# Patient Record
Sex: Female | Born: 1975 | Race: Black or African American | Hispanic: No | Smoking: Former smoker
Health system: Southern US, Community
[De-identification: ages and names within clinical notes are randomized; demographics above are authoritative.]

## PROBLEM LIST (undated history)

## (undated) DIAGNOSIS — I10 Essential (primary) hypertension: Secondary | ICD-10-CM

## (undated) DIAGNOSIS — Z87898 Personal history of other specified conditions: Secondary | ICD-10-CM

## (undated) DIAGNOSIS — Z87820 Personal history of traumatic brain injury: Secondary | ICD-10-CM

## (undated) DIAGNOSIS — K603 Anal fistula, unspecified: Secondary | ICD-10-CM

## (undated) DIAGNOSIS — K6289 Other specified diseases of anus and rectum: Secondary | ICD-10-CM

## (undated) DIAGNOSIS — Z973 Presence of spectacles and contact lenses: Secondary | ICD-10-CM

---

## 2001-10-15 ENCOUNTER — Encounter: Payer: Self-pay | Admitting: Emergency Medicine

## 2001-10-15 ENCOUNTER — Emergency Department (HOSPITAL_COMMUNITY): Admission: EM | Admit: 2001-10-15 | Discharge: 2001-10-15 | Payer: Self-pay | Admitting: Emergency Medicine

## 2001-12-11 ENCOUNTER — Emergency Department (HOSPITAL_COMMUNITY): Admission: EM | Admit: 2001-12-11 | Discharge: 2001-12-11 | Payer: Self-pay | Admitting: Emergency Medicine

## 2001-12-21 ENCOUNTER — Emergency Department (HOSPITAL_COMMUNITY): Admission: EM | Admit: 2001-12-21 | Discharge: 2001-12-21 | Payer: Self-pay | Admitting: Emergency Medicine

## 2001-12-25 HISTORY — PX: TUBAL LIGATION: SHX77

## 2002-06-26 ENCOUNTER — Encounter: Payer: Self-pay | Admitting: Emergency Medicine

## 2002-06-26 ENCOUNTER — Emergency Department (HOSPITAL_COMMUNITY): Admission: EM | Admit: 2002-06-26 | Discharge: 2002-06-26 | Payer: Self-pay | Admitting: Emergency Medicine

## 2003-03-13 ENCOUNTER — Emergency Department (HOSPITAL_COMMUNITY): Admission: EM | Admit: 2003-03-13 | Discharge: 2003-03-13 | Payer: Self-pay | Admitting: Emergency Medicine

## 2003-03-13 ENCOUNTER — Encounter: Payer: Self-pay | Admitting: Emergency Medicine

## 2006-12-25 HISTORY — PX: VAGINAL HYSTERECTOMY: SUR661

## 2013-09-06 ENCOUNTER — Emergency Department (INDEPENDENT_AMBULATORY_CARE_PROVIDER_SITE_OTHER): Payer: Self-pay

## 2013-09-06 ENCOUNTER — Encounter (HOSPITAL_COMMUNITY): Payer: Self-pay | Admitting: Emergency Medicine

## 2013-09-06 ENCOUNTER — Emergency Department (INDEPENDENT_AMBULATORY_CARE_PROVIDER_SITE_OTHER)
Admission: EM | Admit: 2013-09-06 | Discharge: 2013-09-06 | Disposition: A | Discharge status: Home / Self Care | Payer: Self-pay | Source: Home / Self Care | Attending: Family Medicine | Admitting: Family Medicine

## 2013-09-06 DIAGNOSIS — J069 Acute upper respiratory infection, unspecified: Secondary | ICD-10-CM

## 2013-09-06 DIAGNOSIS — M940 Chondrocostal junction syndrome [Tietze]: Secondary | ICD-10-CM

## 2013-09-06 MED ORDER — MELOXICAM 15 MG PO TABS: 15.0000 mg | ORAL_TABLET | Freq: Every day | ORAL | Status: DC | PRN

## 2013-09-06 NOTE — ED Notes (Signed)
Pt c/o pain on left side rib cage onset wed Reports she was horse playing w/a friend and believes she was elbowed Pain increases w/activity Also c/o cold sxs onset thurs Sxs include: ithcy throat, congestion, dry cough Alert w/no signs of acute distress.

## 2013-09-06 NOTE — ED Provider Notes (Signed)
Kendra Vasquez is a 37 y.o. female who presents to Urgent Care today for left rib pain. Patient was horse playing with a friend Wednesday when she was elbowed in the lateral ribs. She has continued left lateral rib pain. The pain is worse with deep inspiration and motion. She denies any significant pain at rest trouble breathing palpitations fevers or chills. She does note runny nose congestion and sore throat. This is been present since Thursday. She's tried some over-the-counter medications which been marginally helpful. She denies any nausea vomiting or diarrhea   History reviewed. No pertinent past medical history. History  Substance Use Topics  . Smoking status: Current Every Day Smoker  . Smokeless tobacco: Not on file  . Alcohol Use: No   ROS as above Medications reviewed. No current facility-administered medications for this encounter.   Current Outpatient Prescriptions  Medication Sig Dispense Refill  . meloxicam (MOBIC) 15 MG tablet Take 1 tablet (15 mg total) by mouth daily as needed for pain.  30 tablet  0    Exam:  BP 117/84  Pulse 76  Temp(Src) 97.6 F (36.4 C) (Kendra)  Resp 17  SpO2 98% Gen: Well NAD HEENT: EOMI,  MMM, clear nasal discharge Lungs: CTABL Nl WOB Chest wall: Tender palpation left lateral chest wall Heart: RRR no MRG Abd: NABS, NT, ND Exts: Non edematous BL  LE, warm and well perfused.   No results found for this or any previous visit (from the past 24 hour(s)). Dg Ribs Unilateral W/chest Left  09/06/2013   CLINICAL DATA:  Injury left side ribs 09/03/2013 persistent pain  EXAM: LEFT RIBS AND CHEST - 3+ VIEW  COMPARISON:  None.  FINDINGS: No fracture or other bone lesions are seen involving the ribs. There is no evidence of pneumothorax or pleural effusion. Both lungs are clear. Heart size and mediastinal contours are within normal limits.  IMPRESSION: Negative.   Electronically Signed   By: Natasha Mead   On: 09/06/2013 16:15    Assessment and  Plan: 37 y.o. female with left rib contusion possibly costochondritis with viral URI.  Plan to use meloxicam for symptomatic management  Followup as needed Discussed warning signs or symptoms. Please see discharge instructions. Patient expresses understanding.     Rodolph Bong, MD 09/06/13 [phone]

## 2014-03-12 ENCOUNTER — Emergency Department (INDEPENDENT_AMBULATORY_CARE_PROVIDER_SITE_OTHER)
Admission: EM | Admit: 2014-03-12 | Discharge: 2014-03-12 | Disposition: A | Discharge status: Home / Self Care | Payer: No Typology Code available for payment source | Source: Home / Self Care | Attending: Emergency Medicine | Admitting: Emergency Medicine

## 2014-03-12 ENCOUNTER — Encounter (HOSPITAL_COMMUNITY): Payer: Self-pay | Admitting: Emergency Medicine

## 2014-03-12 DIAGNOSIS — J019 Acute sinusitis, unspecified: Secondary | ICD-10-CM

## 2014-03-12 DIAGNOSIS — J069 Acute upper respiratory infection, unspecified: Secondary | ICD-10-CM

## 2014-03-12 MED ORDER — FLUTICASONE PROPIONATE 50 MCG/ACT NA SUSP: 2.0000 | Freq: Every day | NASAL | Status: DC

## 2014-03-12 MED ORDER — AMOXICILLIN 500 MG PO CAPS: 1000.0000 mg | ORAL_CAPSULE | Freq: Three times a day (TID) | ORAL | Status: DC

## 2014-03-12 MED ORDER — PREDNISONE 20 MG PO TABS: 20.0000 mg | ORAL_TABLET | Freq: Two times a day (BID) | ORAL | Status: DC

## 2014-03-12 NOTE — Discharge Instructions (Signed)

## 2014-03-12 NOTE — ED Provider Notes (Signed)
  Chief Complaint   Chief Complaint  Patient presents with  . URI    History of Present Illness   Kendra Vasquez is a 38 year old female who's had a one-week history of nasal congestion with yellow to green drainage, sneezing, postnasal drip, headache, sinus pressure, chills, fatigue and malaise, sore throat, and chest pain. She denies any fever, coughing, wheezing, or GI symptoms. She has had no specific sick exposures.  Review of Systems   Other than as noted above, the patient denies any of the following symptoms: Systemic:  No fevers, chills, sweats, or myalgias. Eye:  No redness or discharge. ENT:  No ear pain, headache, nasal congestion, drainage, sinus pressure, or sore throat. Neck:  No neck pain, stiffness, or swollen glands. Lungs:  No cough, sputum production, hemoptysis, wheezing, chest tightness, shortness of breath or chest pain. GI:  No abdominal pain, nausea, vomiting or diarrhea.  [place]   Past medical history, family history, social history, meds, and allergies were reviewed.   Physical exam   Vital signs:  BP 116/65  Pulse 60  Temp(Src) 98.5 F (36.9 C) (Oral)  Resp 18  SpO2 100% General:  Alert and oriented.  In no distress.  Skin warm and dry. Eye:  No conjunctival injection or drainage. Lids were normal. ENT:  TMs and canals were normal, without erythema or inflammation.  Nasal mucosa was clear and uncongested, without drainage.  Mucous membranes were moist.  Pharynx was clear with no exudate or drainage.  There were no oral ulcerations or lesions. Neck:  Supple, no adenopathy, tenderness or mass. Lungs:  No respiratory distress.  Lungs were clear to auscultation, without wheezes, rales or rhonchi.  Breath sounds were clear and equal bilaterally.  Heart:  Regular rhythm, without gallops, murmers or rubs. Skin:  Clear, warm, and dry, without rash or lesions.  Assessment     The primary encounter diagnosis was Viral upper respiratory infection. A  diagnosis of Acute sinusitis was also pertinent to this visit.  Plan    1.  Meds:  The following meds were prescribed:   Discharge Medication List as of 03/12/2014  9:37 AM    START taking these medications   Details  amoxicillin (AMOXIL) 500 MG capsule Take 2 capsules (1,000 mg total) by mouth 3 (three) times daily., Starting 03/12/2014, Until Discontinued, Normal    fluticasone (FLONASE) 50 MCG/ACT nasal spray Place 2 sprays into both nostrils daily., Starting 03/12/2014, Until Discontinued, Normal    predniSONE (DELTASONE) 20 MG tablet Take 1 tablet (20 mg total) by mouth 2 (two) times daily., Starting 03/12/2014, Until Discontinued, Normal        2.  Patient Education/Counseling:  The patient was given appropriate handouts, self care instructions, and instructed in symptomatic relief.  Instructed to get extra fluids, rest, and use a cool mist vaporizer.    3.  Follow up:  The patient was told to follow up here if no better in 3 to 4 days, or sooner if becoming worse in any way, and given some red flag symptoms such as increasing fever, difficulty breathing, chest pain, or persistent vomiting which would prompt immediate return.  Follow up here as needed.      Harden Mo, MD 03/12/14 [phone]

## 2014-03-12 NOTE — ED Notes (Signed)
C/o cold sx  States headache, sneezing, fatigue and head pressure OTC medications was taking

## 2014-05-26 ENCOUNTER — Ambulatory Visit: Payer: Self-pay

## 2014-11-02 ENCOUNTER — Emergency Department (HOSPITAL_COMMUNITY)
Admission: EM | Admit: 2014-11-02 | Discharge: 2014-11-02 | Disposition: A | Discharge status: Home / Self Care | Payer: No Typology Code available for payment source | Attending: Emergency Medicine | Admitting: Emergency Medicine

## 2014-11-02 ENCOUNTER — Emergency Department (HOSPITAL_COMMUNITY): Payer: No Typology Code available for payment source

## 2014-11-02 ENCOUNTER — Encounter (HOSPITAL_COMMUNITY): Payer: Self-pay

## 2014-11-02 DIAGNOSIS — S0990XA Unspecified injury of head, initial encounter: Secondary | ICD-10-CM | POA: Insufficient documentation

## 2014-11-02 DIAGNOSIS — Y9389 Activity, other specified: Secondary | ICD-10-CM | POA: Insufficient documentation

## 2014-11-02 DIAGNOSIS — Z7952 Long term (current) use of systemic steroids: Secondary | ICD-10-CM | POA: Diagnosis not present

## 2014-11-02 DIAGNOSIS — S39012A Strain of muscle, fascia and tendon of lower back, initial encounter: Secondary | ICD-10-CM | POA: Insufficient documentation

## 2014-11-02 DIAGNOSIS — Z791 Long term (current) use of non-steroidal anti-inflammatories (NSAID): Secondary | ICD-10-CM | POA: Diagnosis not present

## 2014-11-02 DIAGNOSIS — Y998 Other external cause status: Secondary | ICD-10-CM | POA: Insufficient documentation

## 2014-11-02 DIAGNOSIS — Y92410 Unspecified street and highway as the place of occurrence of the external cause: Secondary | ICD-10-CM | POA: Insufficient documentation

## 2014-11-02 DIAGNOSIS — Z7951 Long term (current) use of inhaled steroids: Secondary | ICD-10-CM | POA: Insufficient documentation

## 2014-11-02 DIAGNOSIS — Z792 Long term (current) use of antibiotics: Secondary | ICD-10-CM | POA: Insufficient documentation

## 2014-11-02 DIAGNOSIS — R011 Cardiac murmur, unspecified: Secondary | ICD-10-CM | POA: Insufficient documentation

## 2014-11-02 DIAGNOSIS — Z720 Tobacco use: Secondary | ICD-10-CM | POA: Insufficient documentation

## 2014-11-02 DIAGNOSIS — S3992XA Unspecified injury of lower back, initial encounter: Secondary | ICD-10-CM | POA: Diagnosis present

## 2014-11-02 MED ORDER — METAXALONE 800 MG PO TABS: 800.0000 mg | ORAL_TABLET | Freq: Three times a day (TID) | ORAL | Status: DC

## 2014-11-02 MED ORDER — OXYCODONE-ACETAMINOPHEN 5-325 MG PO TABS
1.0000 | ORAL_TABLET | Freq: Once | ORAL | Status: AC
  Administered 2014-11-02: 1 via ORAL
  Filled 2014-11-02: qty 1

## 2014-11-02 MED ORDER — OXYCODONE-ACETAMINOPHEN 5-325 MG PO TABS: 1.0000 | ORAL_TABLET | Freq: Four times a day (QID) | ORAL | Status: DC | PRN

## 2014-11-02 MED ORDER — IBUPROFEN 200 MG PO TABS
600.0000 mg | ORAL_TABLET | Freq: Once | ORAL | Status: AC
  Administered 2014-11-02: 600 mg via ORAL
  Filled 2014-11-02: qty 3
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
## 2014-11-02 NOTE — Discharge Instructions (Signed)
As discussed with you your xray are negative

## 2014-11-02 NOTE — ED Notes (Signed)
Discussed with [name], NP that patient is still complaining of pain.  She gives verbal order for another percocet and plan to ambulate prior to discharge.

## 2014-11-02 NOTE — ED Notes (Signed)
Patient ambulated without difficulty to room 31 to visit with family member.
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
## 2015-03-14 ENCOUNTER — Encounter (HOSPITAL_COMMUNITY): Payer: Self-pay | Admitting: Emergency Medicine

## 2015-03-14 ENCOUNTER — Emergency Department (HOSPITAL_COMMUNITY)
Admission: EM | Admit: 2015-03-14 | Discharge: 2015-03-14 | Disposition: A | Discharge status: Nursing Home (Includes ICF) | Payer: No Typology Code available for payment source | Source: Home / Self Care | Attending: Family Medicine | Admitting: Family Medicine

## 2015-03-14 ENCOUNTER — Emergency Department (HOSPITAL_COMMUNITY)
Admission: EM | Admit: 2015-03-14 | Discharge: 2015-03-14 | Disposition: A | Discharge status: Home / Self Care | Payer: No Typology Code available for payment source | Attending: Emergency Medicine | Admitting: Emergency Medicine

## 2015-03-14 ENCOUNTER — Encounter (HOSPITAL_COMMUNITY): Payer: Self-pay | Admitting: *Deleted

## 2015-03-14 ENCOUNTER — Emergency Department (INDEPENDENT_AMBULATORY_CARE_PROVIDER_SITE_OTHER): Payer: No Typology Code available for payment source

## 2015-03-14 DIAGNOSIS — M546 Pain in thoracic spine: Secondary | ICD-10-CM | POA: Insufficient documentation

## 2015-03-14 DIAGNOSIS — R079 Chest pain, unspecified: Secondary | ICD-10-CM

## 2015-03-14 DIAGNOSIS — R011 Cardiac murmur, unspecified: Secondary | ICD-10-CM | POA: Diagnosis not present

## 2015-03-14 DIAGNOSIS — R05 Cough: Secondary | ICD-10-CM | POA: Insufficient documentation

## 2015-03-14 DIAGNOSIS — Z87891 Personal history of nicotine dependence: Secondary | ICD-10-CM | POA: Diagnosis not present

## 2015-03-14 DIAGNOSIS — Z3202 Encounter for pregnancy test, result negative: Secondary | ICD-10-CM | POA: Insufficient documentation

## 2015-03-14 LAB — BASIC METABOLIC PANEL
Anion gap: 8 (ref 5–15)
BUN: 9 mg/dL (ref 6–23)
CO2: 23 mmol/L (ref 19–32)
Calcium: 9.5 mg/dL (ref 8.4–10.5)
Chloride: 108 mmol/L (ref 96–112)
Creatinine, Ser: 0.88 mg/dL (ref 0.50–1.10)
GFR calc Af Amer: >90 mL/min (ref >90)
GFR calc non Af Amer: 84 mL/min — ABNORMAL LOW (ref >90)
Glucose, Bld: 101 mg/dL — ABNORMAL HIGH (ref 70–99)
Potassium: 4 mmol/L (ref 3.5–5.1)
Sodium: 139 mmol/L (ref 135–145)

## 2015-03-14 LAB — CBC
HCT: 42.2 % (ref 36.0–46.0)
Hemoglobin: 13.9 g/dL (ref 12.0–15.0)
MCH: 29.6 pg (ref 26.0–34.0)
MCHC: 32.9 g/dL (ref 30.0–36.0)
MCV: 89.8 fL (ref 78.0–100.0)
Platelets: 209 10*3/uL (ref 150–400)
RBC: 4.7 MIL/uL (ref 3.87–5.11)
RDW: 12.6 % (ref 11.5–15.5)
WBC: 6 10*3/uL (ref 4.0–10.5)

## 2015-03-14 LAB — I-STAT TROPONIN, ED
Troponin i, poc: 0 ng/mL (ref 0.00–0.08)
Troponin i, poc: 0 ng/mL (ref 0.00–0.08)

## 2015-03-14 LAB — I-STAT BETA HCG BLOOD, ED (MC, WL, AP ONLY): I-stat hCG, quantitative: <5 m[IU]/mL (ref <5)

## 2015-03-14 MED ORDER — NAPROXEN 500 MG PO TABS: 500.0000 mg | ORAL_TABLET | Freq: Two times a day (BID) | ORAL | Status: DC

## 2015-03-14 MED ORDER — ASPIRIN 325 MG PO TABS
325.0000 mg | ORAL_TABLET | Freq: Once | ORAL | Status: AC
  Administered 2015-03-14: 325 mg via ORAL
  Filled 2015-03-14: qty 1

## 2015-03-14 MED ORDER — CYCLOBENZAPRINE HCL 10 MG PO TABS: 10.0000 mg | ORAL_TABLET | Freq: Two times a day (BID) | ORAL | Status: DC | PRN

## 2015-03-14 MED ORDER — MORPHINE SULFATE 4 MG/ML IJ SOLN
4.0000 mg | Freq: Once | INTRAMUSCULAR | Status: AC
Start: 2015-03-14 — End: 2015-03-14
  Administered 2015-03-14: 4 mg via INTRAVENOUS
  Filled 2015-03-14: qty 1

## 2015-03-14 MED ORDER — GI COCKTAIL ~~LOC~~
30.0000 mL | Freq: Once | ORAL | Status: AC
  Administered 2015-03-14: 30 mL via ORAL
  Filled 2015-03-14: qty 30

## 2015-03-14 MED ORDER — DIAZEPAM 5 MG PO TABS
5.0000 mg | ORAL_TABLET | Freq: Once | ORAL | Status: AC
  Administered 2015-03-14: 5 mg via ORAL
  Filled 2015-03-14: qty 1

## 2015-03-14 MED ORDER — TRAMADOL HCL 50 MG PO TABS: 50.0000 mg | ORAL_TABLET | Freq: Four times a day (QID) | ORAL | Status: DC | PRN

## 2015-03-14 MED ORDER — OXYCODONE-ACETAMINOPHEN 5-325 MG PO TABS
1.0000 | ORAL_TABLET | Freq: Once | ORAL | Status: AC
  Administered 2015-03-14: 1 via ORAL
  Filled 2015-03-14: qty 1

## 2015-03-14 MED ORDER — ONDANSETRON HCL 4 MG/2ML IJ SOLN
4.0000 mg | Freq: Once | INTRAMUSCULAR | Status: AC
  Administered 2015-03-14: 4 mg via INTRAVENOUS
  Filled 2015-03-14: qty 2

## 2015-03-14 NOTE — ED Provider Notes (Signed)
CSN: [id number]     Arrival date & time 03/14/15  1150 History   First MD Initiated Contact with Patient 03/14/15 1223     Chief Complaint  Patient presents with  . Chest Pain  . Back Pain     (Consider location/radiation/quality/duration/timing/severity/associated sxs/prior Treatment) HPI Pt is a 39yo female sent to ED from [facility] for further evaluation of left sided chest pain that started earlier this morning after pt woke up. Pt states pain started while she was lying in bed.  Pain is moderate to severe in left side of chest radiating around to left side of back.  Pain is sharp in nature, worse with palpation and certain movements.  Denies worsening pain with cough or deep breathing.  Denies SOB, diaphoresis, n/v/d. She has not taken any pain medication PTA.  Denies hx of similar pain.  Denies recent illness of cough or congestion. She is not on birth control. Denies leg pain or swelling. Denies hx of PE or DVT. No recent travel or sick contacts. No hx of CAD, HTN, high cholesterol, diabetes, or any other significant PMH.  No FH of CAD.  Past Medical History  Diagnosis Date  . Heart murmur    Past Surgical History  Procedure Laterality Date  . Abdominal hysterectomy    . Tubal ligation Bilateral    No family history on file. History  Substance Use Topics  . Smoking status: Former Research scientist (life sciences)  . Smokeless tobacco: Not on file  . Alcohol Use: No   OB History    No data available     Review of Systems  Constitutional: Negative for fever, chills, diaphoresis, appetite change and fatigue.  HENT: Negative for congestion, sore throat, trouble swallowing and voice change.   Respiratory: Positive for cough. Negative for shortness of breath.   Cardiovascular: Positive for chest pain ( left sided). Negative for palpitations and leg swelling.  Gastrointestinal: Negative for nausea, vomiting, abdominal pain and diarrhea.  All other systems reviewed and are negative.     Allergies  Review  of patient's allergies indicates no known allergies.  Home Medications   Prior to Admission medications   Medication Sig Start Date End Date Taking? Authorizing Provider  amoxicillin (AMOXIL) 500 MG capsule Take 2 capsules (1,000 mg total) by mouth 3 (three) times daily. Patient not taking: Reported on 03/14/2015 03/12/14   Harden Mo, MD  cyclobenzaprine (FLEXERIL) 10 MG tablet Take 1 tablet (10 mg total) by mouth 2 (two) times daily as needed for muscle spasms. 03/14/15   Noland Fordyce, PA-C  fluticasone (FLONASE) 50 MCG/ACT nasal spray Place 2 sprays into both nostrils daily. Patient not taking: Reported on 03/14/2015 03/12/14   Harden Mo, MD  meloxicam (MOBIC) 15 MG tablet Take 1 tablet (15 mg total) by mouth daily as needed for pain. Patient not taking: Reported on 03/14/2015 09/06/13   Gregor Hams, MD  metaxalone (SKELAXIN) 800 MG tablet Take 1 tablet (800 mg total) by mouth 3 (three) times daily. Patient not taking: Reported on 03/14/2015 11/02/14   Junius Creamer, NP  naproxen (NAPROSYN) 500 MG tablet Take 1 tablet (500 mg total) by mouth 2 (two) times daily. 03/14/15   Noland Fordyce, PA-C  oxyCODONE-acetaminophen (PERCOCET/ROXICET) 5-325 MG per tablet Take 1-2 tablets by mouth every 6 (six) hours as needed for severe pain. Patient not taking: Reported on 03/14/2015 11/02/14   Junius Creamer, NP  predniSONE (DELTASONE) 20 MG tablet Take 1 tablet (20 mg total) by mouth 2 (two)  times daily. Patient not taking: Reported on 03/14/2015 03/12/14   Harden Mo, MD  traMADol (ULTRAM) 50 MG tablet Take 1 tablet (50 mg total) by mouth every 6 (six) hours as needed. 03/14/15   Noland Fordyce, PA-C   BP 143/95 mmHg  Pulse 61  Temp(Src) 97.4 F (36.3 C) (Oral)  Resp 14  SpO2 100% Physical Exam  Constitutional: She appears well-developed and well-nourished. No distress.  HENT:  Head: Normocephalic and atraumatic.  Eyes: Conjunctivae are normal. No scleral icterus.  Neck: Normal range of motion.   Cardiovascular: Normal rate, regular rhythm and normal heart sounds.   Pulmonary/Chest: Effort normal and breath sounds normal. No respiratory distress. She has no wheezes. She has no rales. She exhibits tenderness.  No respiratory distress, Lungs: CTAB. Tenderness to left anterior chest. No crepitus or deformity noted.  Abdominal: Soft. Bowel sounds are normal. She exhibits no distension and no mass. There is no tenderness. There is no rebound and no guarding.  Musculoskeletal: Normal range of motion.  Neurological: She is alert.  Skin: Skin is warm and dry. No rash noted. She is not diaphoretic. No erythema.  Chest wall: no ecchymosis, erythema or rash  Nursing note and vitals reviewed.   ED Course  Procedures (including critical care time) Labs Review Labs Reviewed  BASIC METABOLIC PANEL - Abnormal; Notable for the following:    Glucose, Bld 101 (*)    GFR calc non Af Amer 84 (*)    All other components within normal limits  CBC  I-STAT TROPOININ, ED  I-STAT BETA HCG BLOOD, ED ([facility], [facility], [facility] ONLY)  I-STAT TROPOININ, ED    Imaging Review Dg Ribs Unilateral W/chest Left  03/14/2015   CLINICAL DATA:  Left-sided chest pain, no known injury  EXAM: LEFT RIBS AND CHEST - 3+ VIEW  COMPARISON:  None.  FINDINGS: Cardiac shadow is at the upper limits of normal in size. The lungs are clear bilaterally. No acute rib fracture is identified. No pneumothorax is noted.  IMPRESSION: No acute abnormality seen.   Electronically Signed   By: Inez Catalina M.D.   On: 03/14/2015 11:18     EKG Interpretation   Date/Time:  Sunday March 14 2015 11:56:12 EDT Ventricular Rate:  73 PR Interval:  176 QRS Duration: 94 QT Interval:  380 QTC Calculation: 418 R Axis:   3 Text Interpretation:  Sinus rhythm with sinus arrhythmia with Fusion  complexes Otherwise normal ECG Confirmed by Johnney Killian, MD, Jeannie Done [id number] on  03/14/2015 2:54:49 PM      MDM   Final diagnoses:  Left sided chest pain  Left-sided  thoracic back pain    Pt is a 39yo female sent to ED from [facility] for further evaluation of left sided anterior chest pain radiating into her back. Pain appears muscular in nature, however, due to questionable EKG, pt sent to ED for full cardiac workup.  Pt is PERC negative. Doubt pneumonia.  CXR from UC: no acute rib fracture. No pneumothorax noted.  No acute abnormality seen.      3:11 PM Pt states pain is worse in left side thoracic muscles but still radiating around to left anterior chest under breast. Pt has tenderness to left side thoracic muscles. Will give pt valium as well as percocet and heat pack as pain appears muscular in nature at this time.   CBC, BMP, and delta i-stat troponin: negative. Doubt ACS.  Pain is reproducible with palpation, likely muscular strain.  Will discharge home to f/u  with PCP. Rx: tramadol, flexeril, and naproxen. Home care instructions provided. Return precautions provided. Pt verbalized understanding and agreement with tx plan.      Noland Fordyce, PA-C 03/14/15 1613  Charlesetta Shanks, MD 03/17/15 2216

## 2015-03-14 NOTE — ED Provider Notes (Signed)
Kendra Vasquez is a 39 y.o. female who presents to Urgent Care today for left-sided chest pain. Patient awoke this morning with moderate to severe left-sided back pain radiating to her left anterior chest. The pain seems to be worse with arm motion. She denies the pain being worse with much deep breathing or cough. She denies exertional pain. No palpitations shortness of breath or syncope. She has not tried any medications yet. She feels well otherwise. She denies any injury.   Past Medical History  Diagnosis Date  . Heart murmur    Past Surgical History  Procedure Laterality Date  . Abdominal hysterectomy    . Tubal ligation Bilateral    History  Substance Use Topics  . Smoking status: Former Research scientist (life sciences)  . Smokeless tobacco: Not on file  . Alcohol Use: No   ROS as above Medications: No current facility-administered medications for this encounter.   Current Outpatient Prescriptions  Medication Sig Dispense Refill  . amoxicillin (AMOXIL) 500 MG capsule Take 2 capsules (1,000 mg total) by mouth 3 (three) times daily. 60 capsule 0  . fluticasone (FLONASE) 50 MCG/ACT nasal spray Place 2 sprays into both nostrils daily. 16 g 0  . meloxicam (MOBIC) 15 MG tablet Take 1 tablet (15 mg total) by mouth daily as needed for pain. 30 tablet 0  . metaxalone (SKELAXIN) 800 MG tablet Take 1 tablet (800 mg total) by mouth 3 (three) times daily. 21 tablet 0  . oxyCODONE-acetaminophen (PERCOCET/ROXICET) 5-325 MG per tablet Take 1-2 tablets by mouth every 6 (six) hours as needed for severe pain. 20 tablet 0  . predniSONE (DELTASONE) 20 MG tablet Take 1 tablet (20 mg total) by mouth 2 (two) times daily. 10 tablet 0   No Known Allergies   Exam:  BP 139/84 mmHg  Pulse 64  Temp(Src) 98.2 F (36.8 C) (Oral)  Resp 18  SpO2 99% Gen: Well NAD HEENT: EOMI,  MMM Lungs: Normal work of breathing. CTABL Heart: RRR no MRG Chest wall: Tender palpation left anterior chest wall Abd: NABS, Soft. Nondistended,  Nontender Exts: Brisk capillary refill, warm and well perfused.  Back: Nontender to spinal midline. Tender palpation left periscapular muscles.   ED ECG REPORT   Date: 03/14/2015  Rate: 61  Rhythm: normal sinus rhythm  QRS Axis: normal  Intervals: normal  ST/T Wave abnormalities: ST elevations anteriorly V2 ST seg elevated. Q wave aVL  Conduction Disutrbances:none  Narrative Interpretation:   Old EKG Reviewed: none available  I have personally reviewed the EKG tracing and agree with the computerized printout as noted.   No results found for this or any previous visit (from the past 24 hour(s)). Dg Ribs Unilateral W/chest Left  03/14/2015   CLINICAL DATA:  Left-sided chest pain, no known injury  EXAM: LEFT RIBS AND CHEST - 3+ VIEW  COMPARISON:  None.  FINDINGS: Cardiac shadow is at the upper limits of normal in size. The lungs are clear bilaterally. No acute rib fracture is identified. No pneumothorax is noted.  IMPRESSION: No acute abnormality seen.   Electronically Signed   By: Inez Catalina M.D.   On: 03/14/2015 11:18    Assessment and Plan: 39 y.o. female with left chest pain. Myofascial's most likely etiology however the severity of the patient's pain in the slightly abnormal EKG is concerning. I do not have a prior EKG to compare to. Transfer to ED via shuttle for further evaluation and management.  Discussed warning signs or symptoms. Please see discharge instructions. Patient  expresses understanding.     Gregor Hams, MD 03/14/15 [phone]

## 2015-03-14 NOTE — ED Notes (Signed)
Pt states she will use her own heating pad

## 2015-03-14 NOTE — ED Notes (Signed)
C/O waking with left mid-back pain wrapping around to left side and under left breast.  Pt's movements guarded; c/o pain worse with any movement or deep breathing.  Also c/o left arm numbness.  Denies any unusual or strenuous activity over past couple days.  Denies n/v, SOB.

## 2015-03-14 NOTE — ED Notes (Signed)
Pt. Stated, I started having shrpe pain in my boob area this morning with back pain.  I went to [facility]  And they sent me to here.

## 2015-03-14 NOTE — ED Notes (Signed)
Pt to be transferred to ED via shuttle per Dr. Georgina Snell.  Report called to Caryl Pina, ED First Nurse.

## 2015-06-09 ENCOUNTER — Encounter (HOSPITAL_COMMUNITY): Payer: Self-pay | Admitting: Emergency Medicine

## 2015-06-09 ENCOUNTER — Emergency Department (INDEPENDENT_AMBULATORY_CARE_PROVIDER_SITE_OTHER)
Admission: EM | Admit: 2015-06-09 | Discharge: 2015-06-09 | Disposition: A | Discharge status: Home / Self Care | Payer: No Typology Code available for payment source | Source: Home / Self Care | Attending: Family Medicine | Admitting: Family Medicine

## 2015-06-09 DIAGNOSIS — J40 Bronchitis, not specified as acute or chronic: Secondary | ICD-10-CM | POA: Diagnosis not present

## 2015-06-09 LAB — POCT RAPID STREP A: Streptococcus, Group A Screen (Direct): NEGATIVE

## 2015-06-09 MED ORDER — AZITHROMYCIN 250 MG PO TABS: 250.0000 mg | ORAL_TABLET | Freq: Every day | ORAL | Status: DC

## 2015-06-09 MED ORDER — GUAIFENESIN-CODEINE 100-10 MG/5ML PO SOLN: 5.0000 mL | Freq: Every evening | ORAL | Status: DC | PRN

## 2015-06-09 MED ORDER — PREDNISONE 10 MG PO TABS: 30.0000 mg | ORAL_TABLET | Freq: Every day | ORAL | Status: DC

## 2015-06-09 NOTE — ED Provider Notes (Signed)
Kendra Vasquez is a 39 y.o. female who presents to Urgent Care today for body aches cough congestion sore throat headache facial pressure nasal discharge. Cough is productive for the last 2 weeks. No significant wheezing or shortness of breath vomiting or diarrhea. She has not tried many medicines.   Past Medical History  Diagnosis Date  . Heart murmur    Past Surgical History  Procedure Laterality Date  . Abdominal hysterectomy    . Tubal ligation Bilateral    History  Substance Use Topics  . Smoking status: Former Research scientist (life sciences)  . Smokeless tobacco: Not on file  . Alcohol Use: No   ROS as above Medications: No current facility-administered medications for this encounter.   Current Outpatient Prescriptions  Medication Sig Dispense Refill  . azithromycin (ZITHROMAX) 250 MG tablet Take 1 tablet (250 mg total) by mouth daily. Take first 2 tablets together, then 1 every day until finished. 6 tablet 0  . guaiFENesin-codeine 100-10 MG/5ML syrup Take 5 mLs by mouth at bedtime as needed for cough. 120 mL 0  . predniSONE (DELTASONE) 10 MG tablet Take 3 tablets (30 mg total) by mouth daily. 15 tablet 0  . [DISCONTINUED] fluticasone (FLONASE) 50 MCG/ACT nasal spray Place 2 sprays into both nostrils daily. (Patient not taking: Reported on 03/14/2015) 16 g 0   No Known Allergies   Exam:  BP 125/91 mmHg  Pulse 69  Temp(Src) 99 F (37.2 C) (Oral)  Resp 16  SpO2 100% Gen: Well NAD HEENT: EOMI,  MMM clear nasal discharge. Posterior pharynx with cobblestoning. Normal tympanic membranes bilaterally. Lungs: Normal work of breathing. Coarse breath sounds bilaterally Heart: RRR no MRG Abd: NABS, Soft. Nondistended, Nontender Exts: Brisk capillary refill, warm and well perfused.   Results for orders placed or performed during the hospital encounter of 06/09/15 (from the past 24 hour(s))  POCT rapid strep A [facility])     Status: None   Collection Time: 06/09/15  4:18 PM  Result Value Ref  Range   Streptococcus, Group A Screen (Direct) NEGATIVE NEGATIVE   No results found.  Assessment and Plan: 39 y.o. female with bronchitis. Treat with prednisone and codeine cough syrup. Use azithromycin if not getting better.  Discussed warning signs or symptoms. Please see discharge instructions. Patient expresses understanding.     Gregor Hams, MD 06/09/15 [phone]

## 2015-06-09 NOTE — Discharge Instructions (Signed)
Thank you for coming in today. Call or go to the emergency room if you get worse, have trouble breathing, have chest pains, or palpitations.  Take prednisone and cough medicine.  Do not drive after taking the cough medicine.  Use azithromycin antibiotic if not better.  Acute Bronchitis Bronchitis is inflammation of the airways that extend from the windpipe into the lungs (bronchi). The inflammation often causes mucus to develop. This leads to a cough, which is the most common symptom of bronchitis.  In acute bronchitis, the condition usually develops suddenly and goes away over time, usually in a couple weeks. Smoking, allergies, and asthma can make bronchitis worse. Repeated episodes of bronchitis may cause further lung problems.  CAUSES Acute bronchitis is most often caused by the same virus that causes a cold. The virus can spread from person to person (contagious) through coughing, sneezing, and touching contaminated objects. SIGNS AND SYMPTOMS   Cough.   Fever.   Coughing up mucus.   Body aches.   Chest congestion.   Chills.   Shortness of breath.   Sore throat.  DIAGNOSIS  Acute bronchitis is usually diagnosed through a physical exam. Your health care provider will also ask you questions about your medical history. Tests, such as chest X-rays, are sometimes done to rule out other conditions.  TREATMENT  Acute bronchitis usually goes away in a couple weeks. Oftentimes, no medical treatment is necessary. Medicines are sometimes given for relief of fever or cough. Antibiotic medicines are usually not needed but may be prescribed in certain situations. In some cases, an inhaler may be recommended to help reduce shortness of breath and control the cough. A cool mist vaporizer may also be used to help thin bronchial secretions and make it easier to clear the chest.  HOME CARE INSTRUCTIONS  Get plenty of rest.   Drink enough fluids to keep your urine clear or pale yellow  (unless you have a medical condition that requires fluid restriction). Increasing fluids may help thin your respiratory secretions (sputum) and reduce chest congestion, and it will prevent dehydration.   Take medicines only as directed by your health care provider.  If you were prescribed an antibiotic medicine, finish it all even if you start to feel better.  Avoid smoking and secondhand smoke. Exposure to cigarette smoke or irritating chemicals will make bronchitis worse. If you are a smoker, consider using nicotine gum or skin patches to help control withdrawal symptoms. Quitting smoking will help your lungs heal faster.   Reduce the chances of another bout of acute bronchitis by washing your hands frequently, avoiding people with cold symptoms, and trying not to touch your hands to your mouth, nose, or eyes.   Keep all follow-up visits as directed by your health care provider.  SEEK MEDICAL CARE IF: Your symptoms do not improve after 1 week of treatment.  SEEK IMMEDIATE MEDICAL CARE IF:  You develop an increased fever or chills.   You have chest pain.   You have severe shortness of breath.  You have bloody sputum.   You develop dehydration.  You faint or repeatedly feel like you are going to pass out.  You develop repeated vomiting.  You develop a severe headache. MAKE SURE YOU:   Understand these instructions.  Will watch your condition.  Will get help right away if you are not doing well or get worse. Document Released: 01/18/2005 Document Revised: 04/27/2014 Document Reviewed: 06/03/2013 [facility] Patient Information 2015 [place], [place]. This information is not intended  to replace advice given to you by your health care provider. Make sure you discuss any questions you have with your health care provider. ° °

## 2015-06-09 NOTE — ED Notes (Signed)
C/o ST associated w/a prod cough onset 1 week Also reports neck, back pain onset Saturday Alert, no signs of acute distress.

## 2015-06-11 LAB — CULTURE, GROUP A STREP: Strep A Culture: NEGATIVE

## 2015-10-20 ENCOUNTER — Emergency Department (HOSPITAL_COMMUNITY)
Admission: EM | Admit: 2015-10-20 | Discharge: 2015-10-20 | Disposition: A | Discharge status: Home / Self Care | Payer: No Typology Code available for payment source | Attending: Emergency Medicine | Admitting: Emergency Medicine

## 2015-10-20 ENCOUNTER — Emergency Department (HOSPITAL_COMMUNITY): Payer: No Typology Code available for payment source

## 2015-10-20 ENCOUNTER — Encounter (HOSPITAL_COMMUNITY): Payer: Self-pay | Admitting: *Deleted

## 2015-10-20 DIAGNOSIS — S99922A Unspecified injury of left foot, initial encounter: Secondary | ICD-10-CM | POA: Insufficient documentation

## 2015-10-20 DIAGNOSIS — S20212A Contusion of left front wall of thorax, initial encounter: Secondary | ICD-10-CM | POA: Insufficient documentation

## 2015-10-20 DIAGNOSIS — Y939 Activity, unspecified: Secondary | ICD-10-CM | POA: Insufficient documentation

## 2015-10-20 DIAGNOSIS — S060X1A Concussion with loss of consciousness of 30 minutes or less, initial encounter: Secondary | ICD-10-CM | POA: Diagnosis not present

## 2015-10-20 DIAGNOSIS — Y929 Unspecified place or not applicable: Secondary | ICD-10-CM | POA: Insufficient documentation

## 2015-10-20 DIAGNOSIS — R011 Cardiac murmur, unspecified: Secondary | ICD-10-CM | POA: Insufficient documentation

## 2015-10-20 DIAGNOSIS — Z792 Long term (current) use of antibiotics: Secondary | ICD-10-CM | POA: Diagnosis not present

## 2015-10-20 DIAGNOSIS — S92512A Displaced fracture of proximal phalanx of left lesser toe(s), initial encounter for closed fracture: Secondary | ICD-10-CM | POA: Insufficient documentation

## 2015-10-20 DIAGNOSIS — Y999 Unspecified external cause status: Secondary | ICD-10-CM | POA: Diagnosis not present

## 2015-10-20 DIAGNOSIS — Z7952 Long term (current) use of systemic steroids: Secondary | ICD-10-CM | POA: Insufficient documentation

## 2015-10-20 DIAGNOSIS — S80212A Abrasion, left knee, initial encounter: Secondary | ICD-10-CM | POA: Insufficient documentation

## 2015-10-20 DIAGNOSIS — S20219A Contusion of unspecified front wall of thorax, initial encounter: Secondary | ICD-10-CM

## 2015-10-20 DIAGNOSIS — Z87891 Personal history of nicotine dependence: Secondary | ICD-10-CM | POA: Insufficient documentation

## 2015-10-20 DIAGNOSIS — S92502A Displaced unspecified fracture of left lesser toe(s), initial encounter for closed fracture: Secondary | ICD-10-CM

## 2015-10-20 DIAGNOSIS — S0990XA Unspecified injury of head, initial encounter: Secondary | ICD-10-CM | POA: Diagnosis present

## 2015-10-20 MED ORDER — ONDANSETRON 4 MG PO TBDP
8.0000 mg | ORAL_TABLET | Freq: Once | ORAL | Status: AC
  Administered 2015-10-20: 8 mg via ORAL
  Filled 2015-10-20: qty 2

## 2015-10-20 MED ORDER — NAPROXEN 500 MG PO TABS: 500.0000 mg | ORAL_TABLET | Freq: Two times a day (BID) | ORAL | Status: DC

## 2015-10-20 MED ORDER — HYDROMORPHONE HCL 1 MG/ML IJ SOLN
2.0000 mg | Freq: Once | INTRAMUSCULAR | Status: AC
  Administered 2015-10-20: 2 mg via INTRAMUSCULAR
  Filled 2015-10-20: qty 2

## 2015-10-20 MED ORDER — OXYCODONE-ACETAMINOPHEN 5-325 MG PO TABS: 1.0000 | ORAL_TABLET | ORAL | Status: DC | PRN

## 2015-10-20 NOTE — ED Notes (Signed)
MD at bedside. 
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
## 2015-10-20 NOTE — Discharge Instructions (Signed)
Buddy-tape toes as needed.  General Assault Assault includes any behavior or physical attack--whether it is on purpose or not--that results in injury to another person, damage to property, or both. This also includes assault that has not yet happened, but is planned to happen. Threats of assault may be physical, verbal, or written. They may be said or sent by:  Mail.  E-mail.  Text.  Social media.  Fax. The threats may be direct, implied, or understood. WHAT ARE THE DIFFERENT FORMS OF ASSAULT? Forms of assault include:  Physically assaulting a person. This includes physical threats to inflict physical harm as well as:  Slapping.  Hitting.  Poking.  Kicking.  Punching.  Pushing.  Sexually assaulting a person. Sexual assault is any sexual activity that a person is forced, threatened, or coerced to participate in. It may or may not involve physical contact with the person who is assaulting you. You are sexually assaulted if you are forced to have sexual contact of any kind.  Damaging or destroying a person's assistive equipment, such as glasses, canes, or walkers.  Throwing or hitting objects.  Using or displaying a weapon to harm or threaten someone.  Using or displaying an object that appears to be a weapon in a threatening manner.  Using greater physical size or strength to intimidate someone.  Making intimidating or threatening gestures.  Bullying.  Hazing.  Using language that is intimidating, threatening, hostile, or abusive.  Stalking.  Restraining someone with force. WHAT SHOULD I DO IF I EXPERIENCE ASSAULT?  Report assaults, threats, and stalking to the police. Call your local emergency services (911 in the [place] if you are in immediate danger or you need medical help.  You can work with a Chief Executive Officer or an advocate to get legal protection against someone who has assaulted you or threatened you with assault. Protection includes restraining orders and  private addresses. Crimes against you, such as assault, can also be prosecuted through the courts. Laws will vary depending on where you live.   This information is not intended to replace advice given to you by your health care provider. Make sure you discuss any questions you have with your health care provider.   Document Released: 12/11/2005 Document Revised: 01/01/2015 Document Reviewed: 08/28/2014 Elsevier Interactive Patient Education 2016 [employer]  Toe Fracture A toe fracture is a break in one of the toe bones (phalanges). CAUSES This condition may be caused by:  Dropping a heavy object on your toe.  Stubbing your toe.  Overusing your toe or doing repetitive exercise.  Twisting or stretching your toe out of place. RISK FACTORS This condition is more likely to develop in people who:  Play contact sports.  Have a bone disease.  Have a low calcium level. SYMPTOMS The main symptoms of this condition are swelling and pain in the toe. The pain may get worse with standing or walking. Other symptoms include:  Bruising.  Stiffness.  Numbness.  A change in the way the toe looks.  Broken bones that poke through the skin.  Blood beneath the toenail. DIAGNOSIS This condition is diagnosed with a physical exam. You may also have X-rays. TREATMENT  Treatment for this condition depends on the type of fracture and its severity. Treatment may involve:  Taping the broken toe to a toe that is next to it (buddy taping). This is the most common treatment for fractures in which the bone has not moved out of place (nondisplaced fracture).  Wearing a shoe that has  a wide, rigid sole to protect the toe and to limit its movement.  Wearing a walking cast.  Having a procedure to move the toe back into place.  Surgery. This may be needed:  If there are many pieces of broken bone that are out of place (displaced).  If the toe joint breaks.  If the bone breaks through the  skin.  Physical therapy. This is done to help regain movement and strength in the toe. You may need follow-up X-rays to make sure that the bone is healing well and staying in position. HOME CARE INSTRUCTIONS If You Have a Cast:  Do not stick anything inside the cast to scratch your skin. Doing that increases your risk of infection.  Check the skin around the cast every day. Report any concerns to your health care provider. You may put lotion on dry skin around the edges of the cast. Do not apply lotion to the skin underneath the cast.  Do not put pressure on any part of the cast until it is fully hardened. This may take several hours.  Keep the cast clean and dry. Bathing  Do not take baths, swim, or use a hot tub until your health care provider approves. Ask your health care provider if you can take showers. You may only be allowed to take sponge baths for bathing.  If your health care provider approves bathing and showering, cover the cast or bandage (dressing) with a watertight plastic bag to protect it from water. Do not let the cast or dressing get wet. Managing Pain, Stiffness, and Swelling  If you do not have a cast, apply ice to the injured area, if directed.  Put ice in a plastic bag.  Place a towel between your skin and the bag.  Leave the ice on for 20 minutes, 2-3 times per day.  Move your toes often to avoid stiffness and to lessen swelling.  Raise (elevate) the injured area above the level of your heart while you are sitting or lying down. Driving  Do not drive or operate heavy machinery while taking pain medicine.  Do not drive while wearing a cast on a foot that you use for driving. Activity  Return to your normal activities as directed by your health care provider. Ask your health care provider what activities are safe for you.  Perform exercises daily as directed by your health care provider or physical therapist. Safety  Do not use the injured limb to  support your body weight until your health care provider says that you can. Use crutches or other assistive devices as directed by your health care provider. General Instructions  If your toe was treated with buddy taping, follow your health care provider's instructions for changing the gauze and tape. Change it more often:  The gauze and tape get wet. If this happens, dry the space between the toes.  The gauze and tape are too tight and cause your toe to become pale or numb.  Wear a protective shoe as directed by your health care provider. If you were not given a protective shoe, wear sturdy, supportive shoes. Your shoes should not pinch your toes and should not fit tightly against your toes.  Do not use any tobacco products, including cigarettes, chewing tobacco, or e-cigarettes. Tobacco can delay bone healing. If you need help quitting, ask your health care provider.  Take medicines only as directed by your health care provider.  Keep all follow-up visits as directed by your health  care provider. This is important. SEEK MEDICAL CARE IF:  You have a fever.  Your pain medicine is not helping.  Your toe is cold.  Your toe is numb.  You still have pain after one week of rest and treatment.  You still have pain after your health care provider has said that you can start walking again.  You have pain, tingling, or numbness in your foot that is not going away. SEEK IMMEDIATE MEDICAL CARE IF:  You have severe pain.  You have redness or inflammation in your toe that is getting worse.  You have pain or numbness in your toe that is getting worse.  Your toe turns blue.   This information is not intended to replace advice given to you by your health care provider. Make sure you discuss any questions you have with your health care provider.   Document Released: 12/08/2000 Document Revised: 09/01/2015 Document Reviewed: 10/07/2014 Elsevier Interactive Patient Education 2016 [place] A contusion is a deep bruise. Contusions are the result of a blunt injury to tissues and muscle fibers under the skin. The injury causes bleeding under the skin. The skin overlying the contusion may turn blue, purple, or yellow. Minor injuries will give you a painless contusion, but more severe contusions may stay painful and swollen for a few weeks.  CAUSES  This condition is usually caused by a blow, trauma, or direct force to an area of the body. SYMPTOMS  Symptoms of this condition include:  Swelling of the injured area.  Pain and tenderness in the injured area.  Discoloration. The area may have redness and then turn blue, purple, or yellow. DIAGNOSIS  This condition is diagnosed based on a physical exam and medical history. An X-ray, CT scan, or MRI may be needed to determine if there are any associated injuries, such as broken bones (fractures). TREATMENT  Specific treatment for this condition depends on what area of the body was injured. In general, the best treatment for a contusion is resting, icing, applying pressure to (compression), and elevating the injured area. This is often called the RICE strategy. Over-the-counter anti-inflammatory medicines may also be recommended for pain control.  HOME CARE INSTRUCTIONS   Rest the injured area.  If directed, apply ice to the injured area:  Put ice in a plastic bag.  Place a towel between your skin and the bag.  Leave the ice on for 20 minutes, 2-3 times per day.  If directed, apply light compression to the injured area using an elastic bandage. Make sure the bandage is not wrapped too tightly. Remove and reapply the bandage as directed by your health care provider.  If possible, raise (elevate) the injured area above the level of your heart while you are sitting or lying down.  Take over-the-counter and prescription medicines only as told by your health care provider. SEEK MEDICAL CARE IF:  Your symptoms do  not improve after several days of treatment.  Your symptoms get worse.  You have difficulty moving the injured area. SEEK IMMEDIATE MEDICAL CARE IF:   You have severe pain.  You have numbness in a hand or foot.  Your hand or foot turns pale or cold.   This information is not intended to replace advice given to you by your health care provider. Make sure you discuss any questions you have with your health care provider.   Document Released: 09/20/2005 Document Revised: 09/01/2015 Document Reviewed: 04/28/2015 Elsevier Interactive Patient Education [employer].  Concussion, Adult A concussion, or closed-head injury, is a brain injury caused by a direct blow to the head or by a quick and sudden movement (jolt) of the head or neck. Concussions are usually not life-threatening. Even so, the effects of a concussion can be serious. If you have had a concussion before, you are more likely to experience concussion-like symptoms after a direct blow to the head.  CAUSES  Direct blow to the head, such as from running into another player during a soccer game, being hit in a fight, or hitting your head on a hard surface.  A jolt of the head or neck that causes the brain to move back and forth inside the skull, such as in a car crash. SIGNS AND SYMPTOMS The signs of a concussion can be hard to notice. Early on, they may be missed by you, family members, and health care providers. You may look fine but act or feel differently. Symptoms are usually temporary, but they may last for days, weeks, or even longer. Some symptoms may appear right away while others may not show up for hours or days. Every head injury is different. Symptoms include:  Mild to moderate headaches that will not go away.  A feeling of pressure inside your head.  Having more trouble than usual:  Learning or remembering things you have heard.  Answering questions.  Paying attention or concentrating.  Organizing daily  tasks.  Making decisions and solving problems.  Slowness in thinking, acting or reacting, speaking, or reading.  Getting lost or being easily confused.  Feeling tired all the time or lacking energy (fatigued).  Feeling drowsy.  Sleep disturbances.  Sleeping more than usual.  Sleeping less than usual.  Trouble falling asleep.  Trouble sleeping (insomnia).  Loss of balance or feeling lightheaded or dizzy.  Nausea or vomiting.  Numbness or tingling.  Increased sensitivity to:  Sounds.  Lights.  Distractions.  Vision problems or eyes that tire easily.  Diminished sense of taste or smell.  Ringing in the ears.  Mood changes such as feeling sad or anxious.  Becoming easily irritated or angry for little or no reason.  Lack of motivation.  Seeing or hearing things other people do not see or hear (hallucinations). DIAGNOSIS Your health care provider can usually diagnose a concussion based on a description of your injury and symptoms. He or she will ask whether you passed out (lost consciousness) and whether you are having trouble remembering events that happened right before and during your injury. Your evaluation might include:  A brain scan to look for signs of injury to the brain. Even if the test shows no injury, you may still have a concussion.  Blood tests to be sure other problems are not present. TREATMENT  Concussions are usually treated in an emergency department, in urgent care, or at a clinic. You may need to stay in the hospital overnight for further treatment.  Tell your health care provider if you are taking any medicines, including prescription medicines, over-the-counter medicines, and natural remedies. Some medicines, such as blood thinners (anticoagulants) and aspirin, may increase the chance of complications. Also tell your health care provider whether you have had alcohol or are taking illegal drugs. This information may affect  treatment.  Your health care provider will send you home with important instructions to follow.  How fast you will recover from a concussion depends on many factors. These factors include how severe your concussion is, what part of your brain was  injured, your age, and how healthy you were before the concussion.  Most people with mild injuries recover fully. Recovery can take time. In general, recovery is slower in older persons. Also, persons who have had a concussion in the past or have other medical problems may find that it takes longer to recover from their current injury. HOME CARE INSTRUCTIONS General Instructions  Carefully follow the directions your health care provider gave you.  Only take over-the-counter or prescription medicines for pain, discomfort, or fever as directed by your health care provider.  Take only those medicines that your health care provider has approved.  Do not drink alcohol until your health care provider says you are well enough to do so. Alcohol and certain other drugs may slow your recovery and can put you at risk of further injury.  If it is harder than usual to remember things, write them down.  If you are easily distracted, try to do one thing at a time. For example, do not try to watch TV while fixing dinner.  Talk with family members or close friends when making important decisions.  Keep all follow-up appointments. Repeated evaluation of your symptoms is recommended for your recovery.  Watch your symptoms and tell others to do the same. Complications sometimes occur after a concussion. Older adults with a brain injury may have a higher risk of serious complications, such as a blood clot on the brain.  Tell your teachers, school nurse, school counselor, coach, athletic trainer, or work Freight forwarder about your injury, symptoms, and restrictions. Tell them about what you can or cannot do. They should watch for:  Increased problems with attention or  concentration.  Increased difficulty remembering or learning new information.  Increased time needed to complete tasks or assignments.  Increased irritability or decreased ability to cope with stress.  Increased symptoms.  Rest. Rest helps the brain to heal. Make sure you:  Get plenty of sleep at night. Avoid staying up late at night.  Keep the same bedtime hours on weekends and weekdays.  Rest during the day. Take daytime naps or rest breaks when you feel tired.  Limit activities that require a lot of thought or concentration. These include:  Doing homework or job-related work.  Watching TV.  Working on the computer.  Avoid any situation where there is potential for another head injury (football, hockey, soccer, basketball, martial arts, downhill snow sports and horseback riding). Your condition will get worse every time you experience a concussion. You should avoid these activities until you are evaluated by the appropriate follow-up health care providers. Returning To Your Regular Activities You will need to return to your normal activities slowly, not all at once. You must give your body and brain enough time for recovery.  Do not return to sports or other athletic activities until your health care provider tells you it is safe to do so.  Ask your health care provider when you can drive, ride a bicycle, or operate heavy machinery. Your ability to react may be slower after a brain injury. Never do these activities if you are dizzy.  Ask your health care provider about when you can return to work or school. Preventing Another Concussion It is very important to avoid another brain injury, especially before you have recovered. In rare cases, another injury can lead to permanent brain damage, brain swelling, or death. The risk of this is greatest during the first 7-10 days after a head injury. Avoid injuries by:  Wearing a  seat belt when riding in a car.  Drinking alcohol only  in moderation.  Wearing a helmet when biking, skiing, skateboarding, skating, or doing similar activities.  Avoiding activities that could lead to a second concussion, such as contact or recreational sports, until your health care provider says it is okay.  Taking safety measures in your home.  Remove clutter and tripping hazards from floors and stairways.  Use grab bars in bathrooms and handrails by stairs.  Place non-slip mats on floors and in bathtubs.  Improve lighting in dim areas. SEEK MEDICAL CARE IF:  You have increased problems paying attention or concentrating.  You have increased difficulty remembering or learning new information.  You need more time to complete tasks or assignments than before.  You have increased irritability or decreased ability to cope with stress.  You have more symptoms than before. Seek medical care if you have any of the following symptoms for more than 2 weeks after your injury:  Lasting (chronic) headaches.  Dizziness or balance problems.  Nausea.  Vision problems.  Increased sensitivity to noise or light.  Depression or mood swings.  Anxiety or irritability.  Memory problems.  Difficulty concentrating or paying attention.  Sleep problems.  Feeling tired all the time. SEEK IMMEDIATE MEDICAL CARE IF:  You have severe or worsening headaches. These may be a sign of a blood clot in the brain.  You have weakness (even if only in one hand, leg, or part of the face).  You have numbness.  You have decreased coordination.  You vomit repeatedly.  You have increased sleepiness.  One pupil is larger than the other.  You have convulsions.  You have slurred speech.  You have increased confusion. This may be a sign of a blood clot in the brain.  You have increased restlessness, agitation, or irritability.  You are unable to recognize people or places.  You have neck pain.  It is difficult to wake you up.  You have  unusual behavior changes.  You lose consciousness. MAKE SURE YOU:  Understand these instructions.  Will watch your condition.  Will get help right away if you are not doing well or get worse.   This information is not intended to replace advice given to you by your health care provider. Make sure you discuss any questions you have with your health care provider.   Document Released: 03/02/2004 Document Revised: 01/01/2015 Document Reviewed: 07/03/2013 Elsevier Interactive Patient Education 2016 Elsevier Inc.  Naproxen and naproxen sodium oral immediate-release tablets What is this medicine? NAPROXEN (na PROX en) is a non-steroidal anti-inflammatory drug (NSAID). It is used to reduce swelling and to treat pain. This medicine may be used for dental pain, headache, or painful monthly periods. It is also used for painful joint and muscular problems such as arthritis, tendinitis, bursitis, and gout. This medicine may be used for other purposes; ask your health care provider or pharmacist if you have questions. What should I tell my health care provider before I take this medicine? They need to know if you have any of these conditions: -asthma -cigarette smoker -drink more than 3 alcohol containing drinks a day -heart disease or circulation problems such as heart failure or leg edema (fluid retention) -high blood pressure -kidney disease -liver disease -stomach bleeding or ulcers -an unusual or allergic reaction to naproxen, aspirin, other NSAIDs, other medicines, foods, dyes, or preservatives -pregnant or trying to get pregnant -breast-feeding How should I use this medicine? Take this medicine by mouth  with a glass of water. Follow the directions on the prescription label. Take it with food if your stomach gets upset. Try to not lie down for at least 10 minutes after you take it. Take your medicine at regular intervals. Do not take your medicine more often than directed. Long-term,  continuous use may increase the risk of heart attack or stroke. A special MedGuide will be given to you by the pharmacist with each prescription and refill. Be sure to read this information carefully each time. Talk to your pediatrician regarding the use of this medicine in children. Special care may be needed. Overdosage: If you think you have taken too much of this medicine contact a poison control center or emergency room at once. NOTE: This medicine is only for you. Do not share this medicine with others. What if I miss a dose? If you miss a dose, take it as soon as you can. If it is almost time for your next dose, take only that dose. Do not take double or extra doses. What may interact with this medicine? -alcohol -aspirin -cidofovir -diuretics -lithium -methotrexate -other drugs for inflammation like ketorolac or prednisone -pemetrexed -probenecid -warfarin This list may not describe all possible interactions. Give your health care provider a list of all the medicines, herbs, non-prescription drugs, or dietary supplements you use. Also tell them if you smoke, drink alcohol, or use illegal drugs. Some items may interact with your medicine. What should I watch for while using this medicine? Tell your doctor or health care professional if your pain does not get better. Talk to your doctor before taking another medicine for pain. Do not treat yourself. This medicine does not prevent heart attack or stroke. In fact, this medicine may increase the chance of a heart attack or stroke. The chance may increase with longer use of this medicine and in people who have heart disease. If you take aspirin to prevent heart attack or stroke, talk with your doctor or health care professional. Do not take other medicines that contain aspirin, ibuprofen, or naproxen with this medicine. Side effects such as stomach upset, nausea, or ulcers may be more likely to occur. Many medicines available without a  prescription should not be taken with this medicine. This medicine can cause ulcers and bleeding in the stomach and intestines at any time during treatment. Do not smoke cigarettes or drink alcohol. These increase irritation to your stomach and can make it more susceptible to damage from this medicine. Ulcers and bleeding can happen without warning symptoms and can cause death. You may get drowsy or dizzy. Do not drive, use machinery, or do anything that needs mental alertness until you know how this medicine affects you. Do not stand or sit up quickly, especially if you are an older patient. This reduces the risk of dizzy or fainting spells. This medicine can cause you to bleed more easily. Try to avoid damage to your teeth and gums when you brush or floss your teeth. What side effects may I notice from receiving this medicine? Side effects that you should report to your doctor or health care professional as soon as possible: -black or bloody stools, blood in the urine or vomit -blurred vision -chest pain -difficulty breathing or wheezing -nausea or vomiting -severe stomach pain -skin rash, skin redness, blistering or peeling skin, hives, or itching -slurred speech or weakness on one side of the body -swelling of eyelids, throat, lips -unexplained weight gain or swelling -unusually weak or tired -yellowing  of eyes or skin Side effects that usually do not require medical attention (report to your doctor or health care professional if they continue or are bothersome): -constipation -headache -heartburn This list may not describe all possible side effects. Call your doctor for medical advice about side effects. You may report side effects to FDA at 1-800-FDA-1088. Where should I keep my medicine? Keep out of the reach of children. Store at room temperature between 15 and 30 degrees C (59 and 86 degrees F). Keep container tightly closed. Throw away any unused medicine after the expiration  date. NOTE: This sheet is a summary. It may not cover all possible information. If you have questions about this medicine, talk to your doctor, pharmacist, or health care provider.    2016, Elsevier/Gold Standard. (2009-12-13 20:10:16)  Acetaminophen; Oxycodone tablets What is this medicine? ACETAMINOPHEN; OXYCODONE (a set a MEE noe fen; ox i KOE done) is a pain reliever. It is used to treat moderate to severe pain. This medicine may be used for other purposes; ask your health care provider or pharmacist if you have questions. What should I tell my health care provider before I take this medicine? They need to know if you have any of these conditions: -brain tumor -Crohn's disease, inflammatory bowel disease, or ulcerative colitis -drug abuse or addiction -head injury -heart or circulation problems -if you often drink alcohol -kidney disease or problems going to the bathroom -liver disease -lung disease, asthma, or breathing problems -an unusual or allergic reaction to acetaminophen, oxycodone, other opioid analgesics, other medicines, foods, dyes, or preservatives -pregnant or trying to get pregnant -breast-feeding How should I use this medicine? Take this medicine by mouth with a full glass of water. Follow the directions on the prescription label. You can take it with or without food. If it upsets your stomach, take it with food. Take your medicine at regular intervals. Do not take it more often than directed. Talk to your pediatrician regarding the use of this medicine in children. Special care may be needed. Patients over 22 years old may have a stronger reaction and need a smaller dose. Overdosage: If you think you have taken too much of this medicine contact a poison control center or emergency room at once. NOTE: This medicine is only for you. Do not share this medicine with others. What if I miss a dose? If you miss a dose, take it as soon as you can. If it is almost time for  your next dose, take only that dose. Do not take double or extra doses. What may interact with this medicine? -alcohol -antihistamines -barbiturates like amobarbital, butalbital, butabarbital, methohexital, pentobarbital, phenobarbital, thiopental, and secobarbital -benztropine -drugs for bladder problems like solifenacin, trospium, oxybutynin, tolterodine, hyoscyamine, and methscopolamine -drugs for breathing problems like ipratropium and tiotropium -drugs for certain stomach or intestine problems like propantheline, homatropine methylbromide, glycopyrrolate, atropine, belladonna, and dicyclomine -general anesthetics like etomidate, ketamine, nitrous oxide, propofol, desflurane, enflurane, halothane, isoflurane, and sevoflurane -medicines for depression, anxiety, or psychotic disturbances -medicines for sleep -muscle relaxants -naltrexone -narcotic medicines (opiates) for pain -phenothiazines like perphenazine, thioridazine, chlorpromazine, mesoridazine, fluphenazine, prochlorperazine, promazine, and trifluoperazine -scopolamine -tramadol -trihexyphenidyl This list may not describe all possible interactions. Give your health care provider a list of all the medicines, herbs, non-prescription drugs, or dietary supplements you use. Also tell them if you smoke, drink alcohol, or use illegal drugs. Some items may interact with your medicine. What should I watch for while using this medicine? Tell your doctor or  health care professional if your pain does not go away, if it gets worse, or if you have new or a different type of pain. You may develop tolerance to the medicine. Tolerance means that you will need a higher dose of the medication for pain relief. Tolerance is normal and is expected if you take this medicine for a long time. Do not suddenly stop taking your medicine because you may develop a severe reaction. Your body becomes used to the medicine. This does NOT mean you are addicted.  Addiction is a behavior related to getting and using a drug for a non-medical reason. If you have pain, you have a medical reason to take pain medicine. Your doctor will tell you how much medicine to take. If your doctor wants you to stop the medicine, the dose will be slowly lowered over time to avoid any side effects. You may get drowsy or dizzy. Do not drive, use machinery, or do anything that needs mental alertness until you know how this medicine affects you. Do not stand or sit up quickly, especially if you are an older patient. This reduces the risk of dizzy or fainting spells. Alcohol may interfere with the effect of this medicine. Avoid alcoholic drinks. There are different types of narcotic medicines (opiates) for pain. If you take more than one type at the same time, you may have more side effects. Give your health care provider a list of all medicines you use. Your doctor will tell you how much medicine to take. Do not take more medicine than directed. Call emergency for help if you have problems breathing. The medicine will cause constipation. Try to have a bowel movement at least every 2 to 3 days. If you do not have a bowel movement for 3 days, call your doctor or health care professional. Do not take Tylenol (acetaminophen) or medicines that have acetaminophen with this medicine. Too much acetaminophen can be very dangerous. Many nonprescription medicines contain acetaminophen. Always read the labels carefully to avoid taking more acetaminophen. What side effects may I notice from receiving this medicine? Side effects that you should report to your doctor or health care professional as soon as possible: -allergic reactions like skin rash, itching or hives, swelling of the face, lips, or tongue -breathing difficulties, wheezing -confusion -light headedness or fainting spells -severe stomach pain -unusually weak or tired -yellowing of the skin or the whites of the eyes Side effects that  usually do not require medical attention (report to your doctor or health care professional if they continue or are bothersome): -dizziness -drowsiness -nausea -vomiting This list may not describe all possible side effects. Call your doctor for medical advice about side effects. You may report side effects to FDA at 1-800-FDA-1088. Where should I keep my medicine? Keep out of the reach of children. This medicine can be abused. Keep your medicine in a safe place to protect it from theft. Do not share this medicine with anyone. Selling or giving away this medicine is dangerous and against the law. This medicine may cause accidental overdose and death if it taken by other adults, children, or pets. Mix any unused medicine with a substance like cat litter or coffee grounds. Then throw the medicine away in a sealed container like a sealed bag or a coffee can with a lid. Do not use the medicine after the expiration date. Store at room temperature between 20 and 25 degrees C (68 and 77 degrees F). NOTE: This sheet is a summary.  It may not cover all possible information. If you have questions about this medicine, talk to your doctor, pharmacist, or health care provider.    2016, Elsevier/Gold Standard. (2014-11-11 15:18:46)
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
## 2015-10-20 NOTE — ED Notes (Signed)
Patient transported to CT 

## 2015-12-01 ENCOUNTER — Encounter (HOSPITAL_COMMUNITY): Payer: Self-pay | Admitting: *Deleted

## 2015-12-01 ENCOUNTER — Emergency Department (INDEPENDENT_AMBULATORY_CARE_PROVIDER_SITE_OTHER)
Admission: EM | Admit: 2015-12-01 | Discharge: 2015-12-01 | Disposition: A | Discharge status: Home / Self Care | Payer: No Typology Code available for payment source | Source: Home / Self Care | Attending: Family Medicine | Admitting: Family Medicine

## 2015-12-01 DIAGNOSIS — M79672 Pain in left foot: Secondary | ICD-10-CM

## 2015-12-01 DIAGNOSIS — F410 Panic disorder [episodic paroxysmal anxiety] without agoraphobia: Secondary | ICD-10-CM

## 2015-12-01 NOTE — ED Provider Notes (Signed)
CSN: [id number]     Arrival date & time 12/01/15  1259 History   First MD Initiated Contact with Patient 12/01/15 1322     Chief Complaint  Patient presents with  . Anxiety   (Consider location/radiation/quality/duration/timing/severity/associated sxs/prior Treatment) Patient is a 39 y.o. female presenting with mental health disorder. The history is provided by the patient.  Mental Health Problem Presenting symptoms: agitation   Presenting symptoms: no homicidal ideas, no suicidal thoughts, no suicidal threats and no suicide attempt   Degree of incapacity (severity):  Mild Onset quality:  Sudden Progression:  Resolved Chronicity:  New Context comment:  Had episode at work were she had an anxiety attack, now resolved, also continued foot problem since injury in Oct. seen in ER requests ortho referral. Associated symptoms: no chest pain     Past Medical History  Diagnosis Date  . Heart murmur    Past Surgical History  Procedure Laterality Date  . Abdominal hysterectomy    . Tubal ligation Bilateral    History reviewed. No pertinent family history. Social History  Substance Use Topics  . Smoking status: Former Research scientist (life sciences)  . Smokeless tobacco: None  . Alcohol Use: No   OB History    No data available     Review of Systems  Constitutional: Negative.   Respiratory: Negative.   Cardiovascular: Positive for palpitations. Negative for chest pain and leg swelling.  Psychiatric/Behavioral: Positive for agitation. Negative for suicidal ideas and homicidal ideas.  All other systems reviewed and are negative.   Allergies  Review of patient's allergies indicates no known allergies.  Home Medications   Prior to Admission medications   Medication Sig Start Date End Date Taking? Authorizing Provider  naproxen (NAPROSYN) 500 MG tablet Take 1 tablet (500 mg total) by mouth 2 (two) times daily. 123XX123   Delora Fuel, MD  oxyCODONE-acetaminophen (PERCOCET) 5-325 MG tablet Take 1 tablet  by mouth every 4 (four) hours as needed for moderate pain. 123XX123   Delora Fuel, MD   Meds Ordered and Administered this Visit  Medications - No data to display  BP 117/77 mmHg  Pulse 77  Temp(Src) 98.8 F (37.1 C) (Oral)  SpO2 100% No data found.   Physical Exam  Constitutional: She is oriented to person, place, and time. She appears well-developed and well-nourished. No distress.  Neck: Normal range of motion. Neck supple.  Cardiovascular: Normal rate, regular rhythm and normal heart sounds.   Pulmonary/Chest: Effort normal and breath sounds normal.  Neurological: She is alert and oriented to person, place, and time.  Skin: Skin is warm and dry. She is not diaphoretic.  Nursing note and vitals reviewed.   ED Course  Procedures (including critical care time)  Labs Review Labs Reviewed - No data to display  Imaging Review No results found.   Visual Acuity Review  Right Eye Distance:   Left Eye Distance:   Bilateral Distance:    Right Eye Near:   Left Eye Near:    Bilateral Near:         MDM   1. Anxiety attack   2. Foot pain, left    Pt reports anxiety resolved and ok for work.   Billy Fischer, MD 12/01/15 [phone]

## 2015-12-01 NOTE — Discharge Instructions (Signed)
See orthopedist if further problems °
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
## 2016-02-14 ENCOUNTER — Other Ambulatory Visit: Payer: Self-pay | Admitting: General Surgery

## 2016-04-03 ENCOUNTER — Encounter (HOSPITAL_BASED_OUTPATIENT_CLINIC_OR_DEPARTMENT_OTHER): Payer: Self-pay | Admitting: *Deleted

## 2016-04-03 NOTE — Progress Notes (Signed)
NPO AFTER MN.  ARRIVE AT 0945.  NEEDS HG.  

## 2016-04-06 ENCOUNTER — Ambulatory Visit (HOSPITAL_BASED_OUTPATIENT_CLINIC_OR_DEPARTMENT_OTHER): Payer: Medicaid Other | Admitting: Anesthesiology

## 2016-04-06 ENCOUNTER — Ambulatory Visit (HOSPITAL_BASED_OUTPATIENT_CLINIC_OR_DEPARTMENT_OTHER)
Admission: RE | Admit: 2016-04-06 | Discharge: 2016-04-06 | Disposition: A | Discharge status: Home / Self Care | Payer: Medicaid Other | Source: Ambulatory Visit | Attending: General Surgery | Admitting: General Surgery

## 2016-04-06 ENCOUNTER — Encounter (HOSPITAL_BASED_OUTPATIENT_CLINIC_OR_DEPARTMENT_OTHER)
Admission: RE | Disposition: A | Discharge status: Home / Self Care | Payer: Self-pay | Source: Ambulatory Visit | Attending: General Surgery

## 2016-04-06 ENCOUNTER — Encounter (HOSPITAL_BASED_OUTPATIENT_CLINIC_OR_DEPARTMENT_OTHER): Payer: Self-pay | Admitting: *Deleted

## 2016-04-06 DIAGNOSIS — K610 Anal abscess: Secondary | ICD-10-CM | POA: Insufficient documentation

## 2016-04-06 DIAGNOSIS — K6289 Other specified diseases of anus and rectum: Secondary | ICD-10-CM | POA: Diagnosis not present

## 2016-04-06 DIAGNOSIS — Z87891 Personal history of nicotine dependence: Secondary | ICD-10-CM | POA: Insufficient documentation

## 2016-04-06 DIAGNOSIS — Z6835 Body mass index (BMI) 35.0-35.9, adult: Secondary | ICD-10-CM | POA: Diagnosis not present

## 2016-04-06 HISTORY — DX: Presence of spectacles and contact lenses: Z97.3

## 2016-04-06 HISTORY — DX: Other specified diseases of anus and rectum: K62.89

## 2016-04-06 HISTORY — DX: Personal history of other specified conditions: Z87.898

## 2016-04-06 HISTORY — PX: EVALUATION UNDER ANESTHESIA WITH ANAL FISTULECTOMY: SHX5621

## 2016-04-06 HISTORY — DX: Personal history of traumatic brain injury: Z87.820

## 2016-04-06 LAB — POCT HEMOGLOBIN-HEMACUE: Hemoglobin: 13.5 g/dL (ref 12.0–15.0)

## 2016-04-06 SURGERY — EXAM UNDER ANESTHESIA WITH ANAL FISTULECTOMY
Anesthesia: Monitor Anesthesia Care | Site: Rectum

## 2016-04-06 MED ORDER — SODIUM CHLORIDE 0.9 % IR SOLN
Status: DC | PRN
Start: 2016-04-06 — End: 2016-04-06
  Administered 2016-04-06: 500 mL

## 2016-04-06 MED ORDER — MIDAZOLAM HCL 2 MG/2ML IJ SOLN
0.5000 mg | Freq: Once | INTRAMUSCULAR | Status: DC | PRN
  Filled 2016-04-06: qty 2

## 2016-04-06 MED ORDER — MEPERIDINE HCL 25 MG/ML IJ SOLN
6.2500 mg | INTRAMUSCULAR | Status: DC | PRN
  Filled 2016-04-06: qty 1

## 2016-04-06 MED ORDER — KETOROLAC TROMETHAMINE 30 MG/ML IJ SOLN
INTRAMUSCULAR | Status: DC | PRN
  Administered 2016-04-06: 30 mg via INTRAVENOUS

## 2016-04-06 MED ORDER — MIDAZOLAM HCL 5 MG/5ML IJ SOLN
INTRAMUSCULAR | Status: DC | PRN
  Administered 2016-04-06: 2 mg via INTRAVENOUS

## 2016-04-06 MED ORDER — BUPIVACAINE-EPINEPHRINE 0.5% -1:200000 IJ SOLN
INTRAMUSCULAR | Status: DC | PRN
  Administered 2016-04-06: 10 mL

## 2016-04-06 MED ORDER — DEXAMETHASONE SODIUM PHOSPHATE 4 MG/ML IJ SOLN
INTRAMUSCULAR | Status: DC | PRN
  Administered 2016-04-06: 10 mg via INTRAVENOUS

## 2016-04-06 MED ORDER — FENTANYL CITRATE (PF) 100 MCG/2ML IJ SOLN
INTRAMUSCULAR | Status: AC
  Filled 2016-04-06: qty 2

## 2016-04-06 MED ORDER — PROPOFOL 10 MG/ML IV BOLUS
INTRAVENOUS | Status: DC | PRN
  Administered 2016-04-06: 40 mg via INTRAVENOUS

## 2016-04-06 MED ORDER — PROMETHAZINE HCL 25 MG/ML IJ SOLN
6.2500 mg | INTRAMUSCULAR | Status: DC | PRN
  Filled 2016-04-06: qty 1

## 2016-04-06 MED ORDER — MIDAZOLAM HCL 2 MG/2ML IJ SOLN
INTRAMUSCULAR | Status: AC
  Filled 2016-04-06: qty 2

## 2016-04-06 MED ORDER — FENTANYL CITRATE (PF) 100 MCG/2ML IJ SOLN
25.0000 ug | INTRAMUSCULAR | Status: DC | PRN
  Filled 2016-04-06: qty 1

## 2016-04-06 MED ORDER — ONDANSETRON HCL 4 MG/2ML IJ SOLN
INTRAMUSCULAR | Status: DC | PRN
  Administered 2016-04-06: 4 mg via INTRAVENOUS

## 2016-04-06 MED ORDER — LIDOCAINE 5 % EX OINT
TOPICAL_OINTMENT | CUTANEOUS | Status: DC | PRN
  Administered 2016-04-06: 1

## 2016-04-06 MED ORDER — LABETALOL HCL 5 MG/ML IV SOLN
5.0000 mg | INTRAVENOUS | Status: DC | PRN
  Administered 2016-04-06 (×3): 5 mg via INTRAVENOUS
  Filled 2016-04-06: qty 4

## 2016-04-06 MED ORDER — LACTATED RINGERS IV SOLN
INTRAVENOUS | Status: DC
  Administered 2016-04-06 (×2): via INTRAVENOUS
  Filled 2016-04-06: qty 1000

## 2016-04-06 MED ORDER — LIDOCAINE HCL (CARDIAC) 20 MG/ML IV SOLN
INTRAVENOUS | Status: DC | PRN
  Administered 2016-04-06: 100 mg via INTRAVENOUS

## 2016-04-06 MED ORDER — FENTANYL CITRATE (PF) 100 MCG/2ML IJ SOLN
INTRAMUSCULAR | Status: DC | PRN
  Administered 2016-04-06: 100 ug via INTRAVENOUS

## 2016-04-06 MED ORDER — LABETALOL HCL 5 MG/ML IV SOLN
INTRAVENOUS | Status: AC
  Filled 2016-04-06: qty 4

## 2016-04-06 MED ORDER — HYDROGEN PEROXIDE 3 % EX SOLN
CUTANEOUS | Status: DC | PRN
  Administered 2016-04-06: 1

## 2016-04-06 MED ORDER — OXYCODONE HCL 5 MG PO TABS: 5.0000 mg | ORAL_TABLET | ORAL | Status: DC | PRN

## 2016-04-06 MED ORDER — PROPOFOL 500 MG/50ML IV EMUL
INTRAVENOUS | Status: DC | PRN
  Administered 2016-04-06: 50 ug/kg/min via INTRAVENOUS

## 2016-04-06 SURGICAL SUPPLY — 48 items
BENZOIN TINCTURE PRP APPL 2/3 (GAUZE/BANDAGES/DRESSINGS) ×4 IMPLANT
BLADE SURG 10 STRL SS (BLADE) IMPLANT
BLADE SURG 15 STRL LF DISP TIS (BLADE) ×2 IMPLANT
BLADE SURG 15 STRL SS (BLADE) ×2
BRIEF STRETCH FOR OB PAD LRG (UNDERPADS AND DIAPERS) ×8 IMPLANT
CANISTER SUCTION 2500CC (MISCELLANEOUS) ×4 IMPLANT
COVER BACK TABLE 60X90IN (DRAPES) ×4 IMPLANT
COVER MAYO STAND STRL (DRAPES) ×4 IMPLANT
DRAPE LAPAROTOMY 100X72 PEDS (DRAPES) ×4 IMPLANT
DRAPE UTILITY XL STRL (DRAPES) ×4 IMPLANT
DRSG PAD ABDOMINAL 8X10 ST (GAUZE/BANDAGES/DRESSINGS) ×4 IMPLANT
ELECT REM PT RETURN 9FT ADLT (ELECTROSURGICAL) ×4
ELECTRODE REM PT RTRN 9FT ADLT (ELECTROSURGICAL) ×2 IMPLANT
GLOVE BIO SURGEON STRL SZ 6.5 (GLOVE) ×6 IMPLANT
GLOVE BIO SURGEONS STRL SZ 6.5 (GLOVE) ×2
GLOVE INDICATOR 7.0 STRL GRN (GLOVE) ×8 IMPLANT
GOWN STRL REUS W/ TWL LRG LVL3 (GOWN DISPOSABLE) ×2 IMPLANT
GOWN STRL REUS W/ TWL XL LVL3 (GOWN DISPOSABLE) ×4 IMPLANT
GOWN STRL REUS W/TWL 2XL LVL3 (GOWN DISPOSABLE) ×4 IMPLANT
GOWN STRL REUS W/TWL LRG LVL3 (GOWN DISPOSABLE) ×2
GOWN STRL REUS W/TWL XL LVL3 (GOWN DISPOSABLE) ×4
HYDROGEN PEROXIDE 16OZ (MISCELLANEOUS) ×4 IMPLANT
KIT ROOM TURNOVER WOR (KITS) ×4 IMPLANT
NEEDLE HYPO 25X1 1.5 SAFETY (NEEDLE) ×4 IMPLANT
NS IRRIG 500ML POUR BTL (IV SOLUTION) ×4 IMPLANT
PACK BASIN DAY SURGERY FS (CUSTOM PROCEDURE TRAY) ×4 IMPLANT
PAD ABD 8X10 STRL (GAUZE/BANDAGES/DRESSINGS) ×4 IMPLANT
PAD ARMBOARD 7.5X6 YLW CONV (MISCELLANEOUS) ×4 IMPLANT
PENCIL BUTTON HOLSTER BLD 10FT (ELECTRODE) ×4 IMPLANT
SPONGE GAUZE 4X4 12PLY STER LF (GAUZE/BANDAGES/DRESSINGS) ×4 IMPLANT
SUCTION FRAZIER HANDLE 10FR (MISCELLANEOUS)
SUCTION TUBE FRAZIER 10FR DISP (MISCELLANEOUS) IMPLANT
SUT CHROMIC 2 0 SH (SUTURE) IMPLANT
SUT CHROMIC 3 0 SH 27 (SUTURE) IMPLANT
SUT ETHIBOND 0 (SUTURE) IMPLANT
SUT SILK 2 0 (SUTURE)
SUT SILK 2-0 18XBRD TIE 12 (SUTURE) IMPLANT
SUT VIC AB 3-0 SH 18 (SUTURE) IMPLANT
SUT VIC AB 3-0 SH 27 (SUTURE) ×2
SUT VIC AB 3-0 SH 27X BRD (SUTURE) ×2 IMPLANT
SUT VIC AB 4-0 P-3 18XBRD (SUTURE) IMPLANT
SUT VIC AB 4-0 P3 18 (SUTURE)
SYR CONTROL 10ML LL (SYRINGE) ×4 IMPLANT
TOWEL OR 17X24 6PK STRL BLUE (TOWEL DISPOSABLE) ×4 IMPLANT
TRAY DSU PREP LF (CUSTOM PROCEDURE TRAY) ×4 IMPLANT
TUBE CONNECTING 12'X1/4 (SUCTIONS) ×1
TUBE CONNECTING 12X1/4 (SUCTIONS) ×3 IMPLANT
YANKAUER SUCT BULB TIP NO VENT (SUCTIONS) ×4 IMPLANT

## 2016-04-06 NOTE — Discharge Instructions (Addendum)

## 2016-04-06 NOTE — Transfer of Care (Signed)
Immediate Anesthesia Transfer of Care Note  Patient: Kendra Vasquez [facility]  Procedure(s) Performed: Procedure(s): ANAL EXAM UNDER ANESTHESIA WITH POSSIBLE  ANAL FISTULOTOMY POSSIBLE EXCISION OF PERIANAL MASS (N/A) POSSIBLE PLACEMENT OF SETON (N/A)  Patient Location: PACU  Anesthesia Type:MAC  Level of Consciousness: awake, alert  and oriented  Airway & Oxygen Therapy: Patient Spontanous Breathing and Patient connected to nasal cannula oxygen  Post-op Assessment: Report given to RN  Post vital signs: Reviewed and stable  Last Vitals:  Filed Vitals:   04/06/16 0938 04/06/16 1110  BP: 138/97 143/97  Pulse: 66 67  Temp: 36.9 C 36.6 C  Resp: 16 14    Complications: No apparent anesthesia complications

## 2016-04-06 NOTE — Anesthesia Procedure Notes (Signed)
Procedure Name: MAC Date/Time: 04/06/2016 10:26 AM Performed by: Bethena Roys T Oxygen Delivery Method: Nasal cannula Placement Confirmation: positive ETCO2 and CO2 detector

## 2016-04-06 NOTE — H&P (Signed)
The patient is a 40 year old female who presents with a complaint of anal problems. Patient complains of approximately 1 year of intermittent anal abscesses. She states that these occasionally are associated with bloody bowel movements. Prior to this she has never had any other episodes. She does endorse a history of hydradenitis in other areas.   Other Problems Mammie Lorenzo, LPN; 624THL 075-GRM PM) No pertinent past medical history  Past Surgical History Mammie Lorenzo, LPN; 624THL 075-GRM PM) Hemorrhoidectomy Hysterectomy (not due to cancer) - Partial  Diagnostic Studies History Mammie Lorenzo, LPN; 624THL 075-GRM PM) Colonoscopy never Mammogram never Pap Smear 1-5 years ago  Allergies Mammie Lorenzo, LPN; 624THL [id number] PM) No Known Drug Allergies02/20/2017  Medication History Mammie Lorenzo, LPN; 624THL [id number] PM) No Current Medications Medications Reconciled  Social History Mammie Lorenzo, LPN; 624THL 075-GRM PM) No alcohol use No caffeine use No drug use Tobacco use Former smoker.  Family History Mammie Lorenzo, LPN; 624THL 075-GRM PM) Diabetes Mellitus Mother. Hypertension Mother.  Pregnancy / Birth History Mammie Lorenzo, LPN; 624THL 075-GRM PM) Age at menarche 69 years. Gravida 4 Irregular periods Maternal age 19-20 Para 2    Review of Systems  General Not Present- Appetite Loss, Chills, Fatigue, Fever, Night Sweats, Weight Gain and Weight Loss. Skin Present- Non-Healing Wounds. Not Present- Change in Wart/Mole, Dryness, Hives, Jaundice, New Lesions, Rash and Ulcer. HEENT Not Present- Earache, Hearing Loss, Hoarseness, Nose Bleed, Oral Ulcers, Ringing in the Ears, Seasonal Allergies, Sinus Pain, Sore Throat, Visual Disturbances, Wears glasses/contact lenses and Yellow Eyes. Respiratory Not Present- Bloody sputum, Chronic Cough, Difficulty Breathing, Snoring and Wheezing. Breast Not Present- Breast Mass, Breast Pain, Nipple Discharge  and Skin Changes. Cardiovascular Not Present- Chest Pain, Difficulty Breathing Lying Down, Leg Cramps, Palpitations, Rapid Heart Rate, Shortness of Breath and Swelling of Extremities. Gastrointestinal Present- Abdominal Pain and Hemorrhoids. Not Present- Bloating, Bloody Stool, Change in Bowel Habits, Chronic diarrhea, Constipation, Difficulty Swallowing, Excessive gas, Gets full quickly at meals, Indigestion, Nausea, Rectal Pain and Vomiting. Female Genitourinary Not Present- Frequency, Nocturia, Painful Urination, Pelvic Pain and Urgency. Musculoskeletal Not Present- Back Pain, Joint Pain, Joint Stiffness, Muscle Pain, Muscle Weakness and Swelling of Extremities. Neurological Not Present- Decreased Memory, Fainting, Headaches, Numbness, Seizures, Tingling, Tremor, Trouble walking and Weakness. Psychiatric Not Present- Anxiety, Bipolar, Change in Sleep Pattern, Depression, Fearful and Frequent crying. Endocrine Present- Hot flashes. Not Present- Cold Intolerance, Excessive Hunger, Hair Changes, Heat Intolerance and New Diabetes. Hematology Not Present- Easy Bruising, Excessive bleeding, Gland problems, HIV and Persistent Infections.  BP 138/97 mmHg  Pulse 66  Temp(Src) 98.4 F (36.9 C) (Oral)  Resp 16  Ht 5\' 9"  (1.753 m)  Wt 108.41 kg (239 lb)  BMI 35.28 kg/m2  SpO2 100%    Physical Exam  General Mental Status-Alert. General Appearance-Not in acute distress. Build & Nutrition-Well nourished. Posture-Normal posture. Gait-Normal.  Head and Neck Head-normocephalic, atraumatic with no lesions or palpable masses. Trachea-midline.  Chest and Lung Exam Chest and lung exam reveals -on auscultation, normal breath sounds, no adventitious sounds and normal vocal resonance.  Cardiovascular Cardiovascular examination reveals -normal heart sounds, regular rate and rhythm with no murmurs.  Abdomen Inspection Inspection of the abdomen reveals - No  Hernias. Palpation/Percussion Palpation and Percussion of the abdomen reveal - Soft, Non Tender, No Rigidity (guarding), No hepatosplenomegaly and No Palpable abdominal masses.  Neurologic Neurologic evaluation reveals -alert and oriented x 3 with no impairment of recent or remote memory, normal attention span and ability to concentrate, normal sensation  and normal coordination.  Musculoskeletal Normal Exam - Bilateral-Upper Extremity Strength Normal and Lower Extremity Strength Normal.    Assessment & Plan MASS OF ANUS (K62.89) Impression: 40 year old female with [place] one-year history of anal pain and drainage. On exam she has what appears to be a chronic infection in her left posterior perianal region. This most likely is a fistula or recurrent hidradenitis. I have recommended exam under anesthesia with fistulotomy versus seton placement if the fistula is found and excision if this appears to be hidradenitis.  Risks include bleeding, infection and possible recurrence.  There is a small chance of incontinence if fistulotomy is performed.  I believe she understands this and has agreed to proceed.

## 2016-04-06 NOTE — Op Note (Signed)
04/06/2016  11:02 AM  PATIENT:  Kendra Vasquez  40 y.o. female  Patient Care Team: No Pcp Per Patient as PCP - General (General Practice)  PRE-OPERATIVE DIAGNOSIS:  anal pain and drainage  POST-OPERATIVE DIAGNOSIS:  anal pain and drainage  PROCEDURE:  ANAL EXAM UNDER ANESTHESIA WITH EXCISION OF PERIANAL MASS    Surgeon(s): Leighton Ruff, MD  ASSISTANT: none   ANESTHESIA:   local and MAC  SPECIMEN:  Source of Specimen:  L perianal mass  DISPOSITION OF SPECIMEN:  PATHOLOGY  COUNTS:  YES  PLAN OF CARE: Discharge to home after PACU  PATIENT DISPOSITION:  PACU - hemodynamically stable.  INDICATION: 40 y.o. F with chronic wound in left lateral perianal area.   OR FINDINGS: Chronic wound grossly consistent with hidradenitis.  No fistula noted.    DESCRIPTION: the patient was identified in the preoperative holding area and taken to the OR where they were laid on the operating room table.  MAC anesthesia was induced without difficulty. The patient was then positioned in prone jackknife position with buttocks gently taped apart.  The patient was then prepped and draped in usual sterile fashion.  SCDs were noted to be in place prior to the initiation of anesthesia. A surgical timeout was performed indicating the correct patient, procedure, positioning and need for preoperative antibiotics.  A rectal block was performed using Marcaine with epinephrine.    I began with a digital rectal exam.  There were no pathological findings internally.  I then placed a Hill-Ferguson anoscope into the anal canal and evaluated this completely.  There was no inflammation and minimal hemorrhoid disease.  I used a fistula probe to traverse the wound. This did not tunnel into the anal canal.  I injected hydrogen peroxide into the tract and this did not exit into the anal canal.  I excised the mass using cautery.  I irrigated the wound with saline and closed it using interrupted 3-0 Vicryl suture.  A dressing  was applied.  All counts were correct per OR staff. The patient tolerated the procedure well and was transferred to the PACU in stable condition.

## 2016-04-06 NOTE — Anesthesia Preprocedure Evaluation (Addendum)
Anesthesia Evaluation  Patient identified by MRN, date of birth, ID band Patient awake    Reviewed: Allergy & Precautions, NPO status , Patient's Chart, lab work & pertinent test results  History of Anesthesia Complications Negative for: history of anesthetic complications  Airway Mallampati: I  TM Distance: >3 FB Neck ROM: Full    Dental  (+) Dental Advisory Given, Caps   Pulmonary former smoker (quit 1/17),    breath sounds clear to auscultation       Cardiovascular (-) anginanegative cardio ROS   Rhythm:Regular Rate:Normal     Neuro/Psych negative neurological ROS     GI/Hepatic negative GI ROS, Neg liver ROS,   Endo/Other  Morbid obesity  Renal/GU negative Renal ROS     Musculoskeletal negative musculoskeletal ROS (+)   Abdominal (+) + obese,   Peds  Hematology negative hematology ROS (+)   Anesthesia Other Findings   Reproductive/Obstetrics                            Anesthesia Physical Anesthesia Plan  ASA: II  Anesthesia Plan: MAC   Post-op Pain Management:    Induction:   Airway Management Planned: Natural Airway and Simple Face Mask  Additional Equipment:   Intra-op Plan:   Post-operative Plan:   Informed Consent: I have reviewed the patients History and Physical, chart, labs and discussed the procedure including the risks, benefits and alternatives for the proposed anesthesia with the patient or authorized representative who has indicated his/her understanding and acceptance.   Dental advisory given  Plan Discussed with: CRNA and Surgeon  Anesthesia Plan Comments: (Plan routine monitors, MAC)        Anesthesia Quick Evaluation

## 2016-04-07 NOTE — Anesthesia Postprocedure Evaluation (Signed)
Anesthesia Post Note  Patient: Kendra Vasquez [facility]  Procedure(s) Performed: Procedure(s) (LRB): ANAL EXAM UNDER ANESTHESIA WITH EXCISION OF CHRONIC PERIANAL MASS (N/A)  Patient location during evaluation: PACU Anesthesia Type: MAC Level of consciousness: awake and alert, oriented and patient cooperative Pain management: pain level controlled Vital Signs Assessment: post-procedure vital signs reviewed and stable Respiratory status: spontaneous breathing, nonlabored ventilation and respiratory function stable Cardiovascular status: blood pressure returned to baseline and stable Postop Assessment: no signs of nausea or vomiting Anesthetic complications: no Comments: Delayed entry, pt eval in PACU post op    Last Vitals:  Filed Vitals:   04/06/16 1241 04/06/16 1259  BP: 138/82 156/92  Pulse: 67 69  Temp:  37 C  Resp:  16    Last Pain: There were no vitals filed for this visit.               Midge Minium

## 2016-04-10 ENCOUNTER — Encounter (HOSPITAL_BASED_OUTPATIENT_CLINIC_OR_DEPARTMENT_OTHER): Payer: Self-pay | Admitting: General Surgery

## 2016-04-19 ENCOUNTER — Emergency Department
Admission: EM | Admit: 2016-04-19 | Discharge: 2016-04-19 | Disposition: A | Discharge status: Home / Self Care | Payer: Medicaid Other | Source: Home / Self Care | Attending: Family Medicine | Admitting: Family Medicine

## 2016-04-19 ENCOUNTER — Encounter: Payer: Self-pay | Admitting: *Deleted

## 2016-04-19 ENCOUNTER — Emergency Department (INDEPENDENT_AMBULATORY_CARE_PROVIDER_SITE_OTHER): Payer: Medicaid Other

## 2016-04-19 DIAGNOSIS — M545 Low back pain, unspecified: Secondary | ICD-10-CM

## 2016-04-19 DIAGNOSIS — M2062 Acquired deformities of toe(s), unspecified, left foot: Secondary | ICD-10-CM | POA: Diagnosis not present

## 2016-04-19 DIAGNOSIS — M79672 Pain in left foot: Secondary | ICD-10-CM | POA: Diagnosis not present

## 2016-04-19 HISTORY — DX: Essential (primary) hypertension: I10

## 2016-04-19 MED ORDER — METHOCARBAMOL 500 MG PO TABS: 500.0000 mg | ORAL_TABLET | Freq: Two times a day (BID) | ORAL | Status: DC

## 2016-04-19 MED ORDER — MELOXICAM 7.5 MG PO TABS: 7.5000 mg | ORAL_TABLET | Freq: Every day | ORAL | Status: DC

## 2016-04-19 MED ORDER — KETOROLAC TROMETHAMINE 60 MG/2ML IM SOLN
60.0000 mg | Freq: Once | INTRAMUSCULAR | Status: AC
  Administered 2016-04-19: 60 mg via INTRAMUSCULAR

## 2016-04-19 NOTE — ED Notes (Signed)
Pt c/o top LT foot pain x 2 days. Denies injury. She also c/o sharp low to mid back pain bilaterally x 4 days. Denies injury. She has taken IBF 800mg , but has not taken any today.

## 2016-04-19 NOTE — Discharge Instructions (Signed)
Meloxicam (Mobic) is an antiinflammatory to help with pain and inflammation.  Do not take ibuprofen, Advil, Aleve, or any other medications that contain NSAIDs while taking meloxicam as this may cause stomach upset or even ulcers if taken in large amounts for an extended period of time.   Robaxin is a muscle relaxer and may cause drowsiness. Do not drink alcohol, drive, or operate heavy machinery while taking.
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
## 2016-09-18 ENCOUNTER — Other Ambulatory Visit: Payer: Self-pay | Admitting: General Surgery

## 2016-09-18 NOTE — H&P (Signed)
The patient is a 40 year old female who presents with a complaint of anal problems. Patient complains of approximately 1 year of intermittent anal abscesses. She states that these occasionally are associated with bloody bowel movements. Prior to this she has never had any other episodes. She does endorse a history of hydradenitis in other areas. She underwent exam under anesthesia in April 2017. No fistula could be found. The area was excised and pathology revealed chronic inflammation. She continues to have anal drainage and pain since that time.   Problem List/Past Medical Leighton Ruff, MD; 0000000 4:36 PM) ANAL FISTULA (K60.3)  Other Problems Leighton Ruff, MD; 0000000 4:36 PM) No pertinent past medical history  Past Surgical History Leighton Ruff, MD; 0000000 4:36 PM) RECTAL EUA [phone]) excision of chronic perianal mass Hemorrhoidectomy Hysterectomy (not due to cancer) - Partial  Diagnostic Studies History Leighton Ruff, MD; 0000000 4:36 PM) Mammogram never Pap Smear 1-5 years ago Colonoscopy never  Allergies Mammie Lorenzo, LPN; [id number] 624THL PM) No Known Drug Allergies 02/14/2016  Medication History Mammie Lorenzo, LPN; [id number] [id number] PM) No Current Medications Medications Reconciled  Social History Leighton Ruff, MD; 0000000 4:36 PM) Tobacco use Former smoker. No drug use No alcohol use No caffeine use  Family History Leighton Ruff, MD; 0000000 4:36 PM) Hypertension Mother. Diabetes Mellitus Mother.  Pregnancy / Birth History Leighton Ruff, MD; 0000000 4:36 PM) Age at menarche 54 years. Para 2 Maternal age 28-20 Gravida 4 Irregular periods     Review of Systems Leighton Ruff MD; 0000000 4:36 PM) General Not Present- Appetite Loss, Chills, Fatigue, Fever, Night Sweats, Weight Gain and Weight Loss. Skin Present- Non-Healing Wounds. Not Present- Change in Wart/Mole, Dryness, Hives, Jaundice, New Lesions, Rash  and Ulcer. HEENT Not Present- Earache, Hearing Loss, Hoarseness, Nose Bleed, Oral Ulcers, Ringing in the Ears, Seasonal Allergies, Sinus Pain, Sore Throat, Visual Disturbances, Wears glasses/contact lenses and Yellow Eyes. Respiratory Not Present- Bloody sputum, Chronic Cough, Difficulty Breathing, Snoring and Wheezing. Breast Not Present- Breast Mass, Breast Pain, Nipple Discharge and Skin Changes. Cardiovascular Not Present- Chest Pain, Difficulty Breathing Lying Down, Leg Cramps, Palpitations, Rapid Heart Rate, Shortness of Breath and Swelling of Extremities. Gastrointestinal Present- Abdominal Pain and Hemorrhoids. Not Present- Bloating, Bloody Stool, Change in Bowel Habits, Chronic diarrhea, Constipation, Difficulty Swallowing, Excessive gas, Gets full quickly at meals, Indigestion, Nausea, Rectal Pain and Vomiting. Female Genitourinary Not Present- Frequency, Nocturia, Painful Urination, Pelvic Pain and Urgency. Musculoskeletal Not Present- Back Pain, Joint Pain, Joint Stiffness, Muscle Pain, Muscle Weakness and Swelling of Extremities. Neurological Not Present- Decreased Memory, Fainting, Headaches, Numbness, Seizures, Tingling, Tremor, Trouble walking and Weakness. Psychiatric Not Present- Anxiety, Bipolar, Change in Sleep Pattern, Depression, Fearful and Frequent crying. Endocrine Present- Hot flashes. Not Present- Cold Intolerance, Excessive Hunger, Hair Changes, Heat Intolerance and New Diabetes. Hematology Not Present- Easy Bruising, Excessive bleeding, Gland problems, HIV and Persistent Infections.  Vitals Claiborne Billings Dockery LPN; X33443 624THL PM) 09/18/2016 4:16 PM Weight: 244.6 lb Height: 69in Body Surface Area: 2.25 m Body Mass Index: 36.12 kg/m  Temp.: 99.29F(Oral)  Pulse: 70 (Regular)  BP: 124/86 (Sitting, Left Arm, Standard)      Physical Exam Leighton Ruff MD; 0000000 4:37 PM)  General Mental Status-Alert. General Appearance-Not in acute  distress. Build & Nutrition-Well nourished. Posture-Normal posture. Gait-Normal.  Head and Neck Head-normocephalic, atraumatic with no lesions or palpable masses. Trachea-midline.  Chest and Lung Exam Chest and lung exam reveals -on auscultation, normal breath sounds, no adventitious sounds and normal vocal resonance.  Cardiovascular  Cardiovascular examination reveals -normal heart sounds, regular rate and rhythm with no murmurs.  Abdomen Inspection Inspection of the abdomen reveals - No Hernias. Palpation/Percussion Palpation and Percussion of the abdomen reveal - Soft, Non Tender, No Rigidity (guarding), No hepatosplenomegaly and No Palpable abdominal masses.  Rectal Anorectal Exam External - Note: Left lateral anal mass. Fistula probe inserted and traverses toward the anal canal.  Neurologic Neurologic evaluation reveals -alert and oriented x 3 with no impairment of recent or remote memory, normal attention span and ability to concentrate, normal sensation and normal coordination.  Musculoskeletal Normal Exam - Bilateral-Upper Extremity Strength Normal and Lower Extremity Strength Normal.    Assessment & Plan Leighton Ruff MD; 0000000 4:34 PM)  ANAL FISTULA (K60.3) Impression: 40 year old female who presents to the office after exam under anesthesia with resection of an anal canal mass in April 2017. During that examination she had no signs of fistula. She continues to have chronic pain. On exam today she has a left-sided anal mass which is draining fluid. The fistula probe traverses this easily into the anal canal. I have recommended an exam under anesthesia with possible fistulotomy versus seton placement.

## 2016-10-22 ENCOUNTER — Encounter (HOSPITAL_COMMUNITY): Payer: Self-pay

## 2016-10-22 ENCOUNTER — Emergency Department (HOSPITAL_COMMUNITY)
Admission: EM | Admit: 2016-10-22 | Discharge: 2016-10-22 | Disposition: A | Discharge status: Home / Self Care | Payer: Medicaid Other | Attending: Emergency Medicine | Admitting: Emergency Medicine

## 2016-10-22 DIAGNOSIS — Z87891 Personal history of nicotine dependence: Secondary | ICD-10-CM | POA: Insufficient documentation

## 2016-10-22 DIAGNOSIS — M62838 Other muscle spasm: Secondary | ICD-10-CM

## 2016-10-22 DIAGNOSIS — M542 Cervicalgia: Secondary | ICD-10-CM | POA: Diagnosis present

## 2016-10-22 DIAGNOSIS — I10 Essential (primary) hypertension: Secondary | ICD-10-CM | POA: Insufficient documentation

## 2016-10-22 MED ORDER — KETOROLAC TROMETHAMINE 60 MG/2ML IM SOLN
60.0000 mg | Freq: Once | INTRAMUSCULAR | Status: AC
  Administered 2016-10-22: 60 mg via INTRAMUSCULAR
  Filled 2016-10-22: qty 2

## 2016-10-22 MED ORDER — TRAMADOL HCL 50 MG PO TABS: 50.0000 mg | ORAL_TABLET | Freq: Four times a day (QID) | ORAL | 0 refills | Status: DC | PRN

## 2016-10-22 MED ORDER — CYCLOBENZAPRINE HCL 10 MG PO TABS: 10.0000 mg | ORAL_TABLET | Freq: Two times a day (BID) | ORAL | 0 refills | Status: DC | PRN
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
## 2016-10-22 NOTE — ED Triage Notes (Signed)
Patient complains of posterior neck pain since Wednesday, denies trauma. Using otc meds with minimal relief, NAD

## 2016-10-22 NOTE — Discharge Instructions (Signed)
Take muscle relaxers, anti-inflammatories and pain medication as needed. Apply ice to affected area. Increase her mobility. Follow-up with her primary care doctor or the [facility] for reevaluation. Return to the ED if he experienced numbness and tingling in your hands, bowel or bladder incontinence.

## 2016-10-23 ENCOUNTER — Emergency Department (HOSPITAL_COMMUNITY): Payer: Medicaid Other

## 2016-10-23 ENCOUNTER — Encounter (HOSPITAL_COMMUNITY): Payer: Self-pay | Admitting: *Deleted

## 2016-10-23 ENCOUNTER — Emergency Department (HOSPITAL_COMMUNITY)
Admission: EM | Admit: 2016-10-23 | Discharge: 2016-10-23 | Disposition: A | Discharge status: Home / Self Care | Payer: Medicaid Other | Attending: Emergency Medicine | Admitting: Emergency Medicine

## 2016-10-23 DIAGNOSIS — M5030 Other cervical disc degeneration, unspecified cervical region: Secondary | ICD-10-CM | POA: Diagnosis not present

## 2016-10-23 DIAGNOSIS — M542 Cervicalgia: Secondary | ICD-10-CM

## 2016-10-23 DIAGNOSIS — I10 Essential (primary) hypertension: Secondary | ICD-10-CM | POA: Insufficient documentation

## 2016-10-23 DIAGNOSIS — Z87891 Personal history of nicotine dependence: Secondary | ICD-10-CM | POA: Diagnosis not present

## 2016-10-23 DIAGNOSIS — R202 Paresthesia of skin: Secondary | ICD-10-CM | POA: Insufficient documentation

## 2016-10-23 LAB — BASIC METABOLIC PANEL
Anion gap: 5 (ref 5–15)
BUN: 15 mg/dL (ref 6–20)
CO2: 24 mmol/L (ref 22–32)
Calcium: 9.8 mg/dL (ref 8.9–10.3)
Chloride: 109 mmol/L (ref 101–111)
Creatinine, Ser: 0.97 mg/dL (ref 0.44–1.00)
GFR calc Af Amer: >60 mL/min (ref >60)
GFR calc non Af Amer: >60 mL/min (ref >60)
Glucose, Bld: 92 mg/dL (ref 65–99)
Potassium: 4.4 mmol/L (ref 3.5–5.1)
Sodium: 138 mmol/L (ref 135–145)

## 2016-10-23 LAB — CBC
HCT: 41.5 % (ref 36.0–46.0)
Hemoglobin: 13.7 g/dL (ref 12.0–15.0)
MCH: 29.4 pg (ref 26.0–34.0)
MCHC: 33 g/dL (ref 30.0–36.0)
MCV: 89.1 fL (ref 78.0–100.0)
Platelets: 232 10*3/uL (ref 150–400)
RBC: 4.66 MIL/uL (ref 3.87–5.11)
RDW: 12.8 % (ref 11.5–15.5)
WBC: 5.8 10*3/uL (ref 4.0–10.5)

## 2016-10-23 LAB — I-STAT TROPONIN, ED: Troponin i, poc: 0 ng/mL (ref 0.00–0.08)

## 2016-10-23 MED ORDER — HYDROCODONE-ACETAMINOPHEN 5-325 MG PO TABS: 1.0000 | ORAL_TABLET | ORAL | 0 refills | Status: DC | PRN

## 2016-10-23 MED ORDER — IBUPROFEN 600 MG PO TABS: 600.0000 mg | ORAL_TABLET | Freq: Four times a day (QID) | ORAL | 0 refills | Status: DC | PRN

## 2016-10-23 MED ORDER — KETOROLAC TROMETHAMINE 30 MG/ML IJ SOLN
15.0000 mg | Freq: Once | INTRAMUSCULAR | Status: DC
  Filled 2016-10-23: qty 1

## 2016-10-23 MED ORDER — KETOROLAC TROMETHAMINE 30 MG/ML IJ SOLN
30.0000 mg | Freq: Once | INTRAMUSCULAR | Status: AC
  Administered 2016-10-23: 30 mg via INTRAMUSCULAR

## 2016-10-23 MED ORDER — DIAZEPAM 5 MG PO TABS
5.0000 mg | ORAL_TABLET | Freq: Once | ORAL | Status: AC
  Administered 2016-10-23: 5 mg via ORAL
  Filled 2016-10-23: qty 1

## 2016-10-23 MED ORDER — OXYCODONE-ACETAMINOPHEN 5-325 MG PO TABS
1.0000 | ORAL_TABLET | Freq: Once | ORAL | Status: AC
  Administered 2016-10-23: 1 via ORAL
  Filled 2016-10-23: qty 1

## 2016-10-23 MED ORDER — DIAZEPAM 5 MG PO TABS
5.0000 mg | ORAL_TABLET | Freq: Two times a day (BID) | ORAL | 0 refills | Status: DC
Start: 2016-10-23 — End: 2016-11-21

## 2016-10-23 NOTE — ED Triage Notes (Signed)
Pt states was tx here yesterday for muscle spasms.  The medicine has done nothing for her.  States L shoulder and arm are in pain and L arm is tingling.  Also c/o dizziness and sob.

## 2016-10-23 NOTE — ED Notes (Signed)
Pt states she understands instructions and has called family to take her home. Home stable via w/c.

## 2016-10-23 NOTE — ED Notes (Signed)
Pt has removed bp cuff and is sleeping on stomach when I enter room. Pt rolls to back when asked with facial grimacing

## 2016-10-23 NOTE — Discharge Instructions (Signed)
Take your medications as prescribed as needed for pain relief. Refrain from taking your old prescription of Flexeril while you are currently taking your prescription of Valium. I also recommend applying ice and/or heat to affected area for 15 minutes 3-4 times daily for additional pain relief. Refrain from doing any repetitive or exacerbating movements for the next few days until your pain has improved. Please follow up with a primary care provider from the Resource Guide provided below in one week if her symptoms have not improved. Please return to the Emergency Department if symptoms worsen or new onset of fever, headache, lightheadedness, visual changes, neck stiffness, numbness, tingling, weakness/paralysis, back pain.

## 2016-10-23 NOTE — ED Notes (Signed)
MD at bedside. 
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
## 2016-10-23 NOTE — ED Notes (Signed)
Patient transported to CT 

## 2016-11-21 ENCOUNTER — Encounter (HOSPITAL_BASED_OUTPATIENT_CLINIC_OR_DEPARTMENT_OTHER): Payer: Self-pay | Admitting: *Deleted

## 2016-11-21 NOTE — Progress Notes (Signed)
NPO AFTER MN WITH EXCEPTION CLEAR LIQUIDS UNTIL 0830 (NO CREAM/ MILK PRODUCTS).  ARRIVE AT [id number].  NEEDS HG.  CURRENT EKG IN CHART AND EPIC.

## 2016-11-24 ENCOUNTER — Encounter (HOSPITAL_BASED_OUTPATIENT_CLINIC_OR_DEPARTMENT_OTHER): Payer: Self-pay | Admitting: *Deleted

## 2016-11-24 ENCOUNTER — Ambulatory Visit (HOSPITAL_BASED_OUTPATIENT_CLINIC_OR_DEPARTMENT_OTHER): Payer: Medicaid Other | Admitting: Anesthesiology

## 2016-11-24 ENCOUNTER — Ambulatory Visit (HOSPITAL_BASED_OUTPATIENT_CLINIC_OR_DEPARTMENT_OTHER)
Admission: RE | Admit: 2016-11-24 | Discharge: 2016-11-24 | Disposition: A | Discharge status: Home / Self Care | Payer: Medicaid Other | Source: Ambulatory Visit | Attending: General Surgery | Admitting: General Surgery

## 2016-11-24 ENCOUNTER — Encounter (HOSPITAL_BASED_OUTPATIENT_CLINIC_OR_DEPARTMENT_OTHER)
Admission: RE | Disposition: A | Discharge status: Home / Self Care | Payer: Self-pay | Source: Ambulatory Visit | Attending: General Surgery

## 2016-11-24 DIAGNOSIS — Z6834 Body mass index (BMI) 34.0-34.9, adult: Secondary | ICD-10-CM | POA: Diagnosis not present

## 2016-11-24 DIAGNOSIS — Z79899 Other long term (current) drug therapy: Secondary | ICD-10-CM | POA: Insufficient documentation

## 2016-11-24 DIAGNOSIS — I10 Essential (primary) hypertension: Secondary | ICD-10-CM | POA: Diagnosis not present

## 2016-11-24 DIAGNOSIS — K603 Anal fistula: Secondary | ICD-10-CM | POA: Insufficient documentation

## 2016-11-24 DIAGNOSIS — Z87891 Personal history of nicotine dependence: Secondary | ICD-10-CM | POA: Insufficient documentation

## 2016-11-24 HISTORY — DX: Anal fistula: K60.3

## 2016-11-24 HISTORY — DX: Anal fistula, unspecified: K60.30

## 2016-11-24 LAB — POCT HEMOGLOBIN-HEMACUE: Hemoglobin: 14.1 g/dL (ref 12.0–15.0)

## 2016-11-24 SURGERY — EXAM UNDER ANESTHESIA
Anesthesia: Monitor Anesthesia Care | Site: Anus

## 2016-11-24 MED ORDER — PROPOFOL 10 MG/ML IV BOLUS
INTRAVENOUS | Status: DC | PRN
  Administered 2016-11-24: 20 mg via INTRAVENOUS

## 2016-11-24 MED ORDER — OXYCODONE HCL 5 MG PO TABS
5.0000 mg | ORAL_TABLET | ORAL | Status: DC | PRN
  Filled 2016-11-24: qty 2

## 2016-11-24 MED ORDER — OXYCODONE HCL 5 MG PO TABS: 5.0000 mg | ORAL_TABLET | ORAL | 0 refills | Status: DC | PRN

## 2016-11-24 MED ORDER — LIDOCAINE 2% (20 MG/ML) 5 ML SYRINGE
INTRAMUSCULAR | Status: AC
Start: 2016-11-24 — End: 2016-11-24
  Filled 2016-11-24: qty 5

## 2016-11-24 MED ORDER — KETAMINE HCL 10 MG/ML IJ SOLN
INTRAMUSCULAR | Status: DC | PRN
  Administered 2016-11-24 (×4): 10 mg via INTRAVENOUS

## 2016-11-24 MED ORDER — ONDANSETRON HCL 4 MG/2ML IJ SOLN
INTRAMUSCULAR | Status: AC
  Filled 2016-11-24: qty 2

## 2016-11-24 MED ORDER — KETAMINE HCL 10 MG/ML IJ SOLN
INTRAMUSCULAR | Status: AC
  Filled 2016-11-24: qty 1

## 2016-11-24 MED ORDER — PROPOFOL 10 MG/ML IV BOLUS
INTRAVENOUS | Status: AC
  Filled 2016-11-24: qty 40

## 2016-11-24 MED ORDER — LIDOCAINE 5 % EX OINT
TOPICAL_OINTMENT | CUTANEOUS | Status: DC | PRN
  Administered 2016-11-24: 1

## 2016-11-24 MED ORDER — FENTANYL CITRATE (PF) 100 MCG/2ML IJ SOLN
INTRAMUSCULAR | Status: DC | PRN
  Administered 2016-11-24 (×4): 25 ug via INTRAVENOUS

## 2016-11-24 MED ORDER — SODIUM CHLORIDE 0.9% FLUSH
3.0000 mL | Freq: Two times a day (BID) | INTRAVENOUS | Status: DC
  Filled 2016-11-24: qty 3

## 2016-11-24 MED ORDER — FENTANYL CITRATE (PF) 100 MCG/2ML IJ SOLN
INTRAMUSCULAR | Status: AC
  Filled 2016-11-24: qty 2

## 2016-11-24 MED ORDER — DEXAMETHASONE SODIUM PHOSPHATE 4 MG/ML IJ SOLN
INTRAMUSCULAR | Status: DC | PRN
  Administered 2016-11-24: 5 mg via INTRAVENOUS

## 2016-11-24 MED ORDER — ONDANSETRON HCL 4 MG/2ML IJ SOLN
INTRAMUSCULAR | Status: DC | PRN
  Administered 2016-11-24: 4 mg via INTRAVENOUS

## 2016-11-24 MED ORDER — DEXAMETHASONE SODIUM PHOSPHATE 10 MG/ML IJ SOLN
INTRAMUSCULAR | Status: AC
  Filled 2016-11-24: qty 1

## 2016-11-24 MED ORDER — KETOROLAC TROMETHAMINE 30 MG/ML IJ SOLN
INTRAMUSCULAR | Status: DC | PRN
  Administered 2016-11-24: 30 mg via INTRAVENOUS

## 2016-11-24 MED ORDER — LIDOCAINE HCL (CARDIAC) 20 MG/ML IV SOLN
INTRAVENOUS | Status: DC | PRN
  Administered 2016-11-24: 60 mg via INTRAVENOUS

## 2016-11-24 MED ORDER — PROMETHAZINE HCL 25 MG/ML IJ SOLN
6.2500 mg | INTRAMUSCULAR | Status: DC | PRN
  Filled 2016-11-24: qty 1

## 2016-11-24 MED ORDER — BUPIVACAINE-EPINEPHRINE 0.5% -1:200000 IJ SOLN
INTRAMUSCULAR | Status: DC | PRN
  Administered 2016-11-24: 30 mL

## 2016-11-24 MED ORDER — HYDROMORPHONE HCL 1 MG/ML IJ SOLN
0.2500 mg | INTRAMUSCULAR | Status: DC | PRN
Start: 2016-11-24 — End: 2016-11-24
  Filled 2016-11-24: qty 0.5

## 2016-11-24 MED ORDER — LACTATED RINGERS IV SOLN
INTRAVENOUS | Status: DC
  Administered 2016-11-24 (×2): via INTRAVENOUS
  Filled 2016-11-24: qty 1000

## 2016-11-24 MED ORDER — MIDAZOLAM HCL 5 MG/5ML IJ SOLN
INTRAMUSCULAR | Status: DC | PRN
  Administered 2016-11-24: 2 mg via INTRAVENOUS

## 2016-11-24 MED ORDER — ACETAMINOPHEN 650 MG RE SUPP
650.0000 mg | RECTAL | Status: DC | PRN
  Filled 2016-11-24: qty 1

## 2016-11-24 MED ORDER — MIDAZOLAM HCL 2 MG/2ML IJ SOLN
INTRAMUSCULAR | Status: AC
  Filled 2016-11-24: qty 2

## 2016-11-24 MED ORDER — ACETAMINOPHEN 325 MG PO TABS
650.0000 mg | ORAL_TABLET | ORAL | Status: DC | PRN
  Filled 2016-11-24: qty 2

## 2016-11-24 MED ORDER — SODIUM CHLORIDE 0.9% FLUSH
3.0000 mL | INTRAVENOUS | Status: DC | PRN
  Filled 2016-11-24: qty 3

## 2016-11-24 MED ORDER — KETOROLAC TROMETHAMINE 30 MG/ML IJ SOLN
INTRAMUSCULAR | Status: AC
  Filled 2016-11-24: qty 1

## 2016-11-24 MED ORDER — PROPOFOL 500 MG/50ML IV EMUL
INTRAVENOUS | Status: DC | PRN
  Administered 2016-11-24: 25 ug/kg/min via INTRAVENOUS

## 2016-11-24 MED ORDER — SODIUM CHLORIDE 0.9 % IV SOLN
250.0000 mL | INTRAVENOUS | Status: DC | PRN
  Filled 2016-11-24: qty 250

## 2016-11-24 SURGICAL SUPPLY — 50 items
BENZOIN TINCTURE PRP APPL 2/3 (GAUZE/BANDAGES/DRESSINGS) ×3 IMPLANT
BLADE HEX COATED 2.75 (ELECTRODE) ×3 IMPLANT
BLADE SURG 10 STRL SS (BLADE) IMPLANT
BLADE SURG 15 STRL LF DISP TIS (BLADE) ×1 IMPLANT
BLADE SURG 15 STRL SS (BLADE) ×2
BRIEF STRETCH FOR OB PAD LRG (UNDERPADS AND DIAPERS) ×3 IMPLANT
CANISTER SUCTION 2500CC (MISCELLANEOUS) ×3 IMPLANT
COVER BACK TABLE 60X90IN (DRAPES) ×3 IMPLANT
COVER MAYO STAND STRL (DRAPES) ×3 IMPLANT
DECANTER SPIKE VIAL GLASS SM (MISCELLANEOUS) ×3 IMPLANT
DRAPE LAPAROTOMY 100X72 PEDS (DRAPES) ×3 IMPLANT
DRAPE LG THREE QUARTER DISP (DRAPES) IMPLANT
DRAPE UTILITY XL STRL (DRAPES) ×3 IMPLANT
DRSG PAD ABDOMINAL 8X10 ST (GAUZE/BANDAGES/DRESSINGS) ×3 IMPLANT
ELECT BLADE 6.5 .24CM SHAFT (ELECTRODE) IMPLANT
ELECT REM PT RETURN 9FT ADLT (ELECTROSURGICAL) ×3
ELECTRODE REM PT RTRN 9FT ADLT (ELECTROSURGICAL) ×1 IMPLANT
GAUZE SPONGE 4X4 16PLY XRAY LF (GAUZE/BANDAGES/DRESSINGS) IMPLANT
GAUZE VASELINE 3X9 (GAUZE/BANDAGES/DRESSINGS) IMPLANT
GLOVE BIO SURGEON STRL SZ 6.5 (GLOVE) ×2 IMPLANT
GLOVE BIO SURGEONS STRL SZ 6.5 (GLOVE) ×1
GLOVE INDICATOR 7.0 STRL GRN (GLOVE) ×3 IMPLANT
GOWN STRL REUS W/ TWL LRG LVL3 (GOWN DISPOSABLE) ×1 IMPLANT
GOWN STRL REUS W/ TWL XL LVL3 (GOWN DISPOSABLE) ×1 IMPLANT
GOWN STRL REUS W/TWL 2XL LVL3 (GOWN DISPOSABLE) ×3 IMPLANT
GOWN STRL REUS W/TWL LRG LVL3 (GOWN DISPOSABLE) ×2
GOWN STRL REUS W/TWL XL LVL3 (GOWN DISPOSABLE) ×2
HYDROGEN PEROXIDE 16OZ (MISCELLANEOUS) IMPLANT
KIT ROOM TURNOVER WOR (KITS) ×3 IMPLANT
LOOP VESSEL MAXI BLUE (MISCELLANEOUS) ×3 IMPLANT
NDL SAFETY ECLIPSE 18X1.5 (NEEDLE) IMPLANT
NEEDLE HYPO 18GX1.5 SHARP (NEEDLE)
NEEDLE HYPO 22GX1.5 SAFETY (NEEDLE) ×3 IMPLANT
NS IRRIG 500ML POUR BTL (IV SOLUTION) ×3 IMPLANT
PACK BASIN DAY SURGERY FS (CUSTOM PROCEDURE TRAY) ×3 IMPLANT
PAD ABD 8X10 STRL (GAUZE/BANDAGES/DRESSINGS) ×3 IMPLANT
PAD ARMBOARD 7.5X6 YLW CONV (MISCELLANEOUS) ×3 IMPLANT
PENCIL BUTTON HOLSTER BLD 10FT (ELECTRODE) ×3 IMPLANT
SPONGE GAUZE 4X4 12PLY STER LF (GAUZE/BANDAGES/DRESSINGS) ×3 IMPLANT
SPONGE SURGIFOAM ABS GEL 12-7 (HEMOSTASIS) IMPLANT
SUT CHROMIC 3 0 SH 27 (SUTURE) IMPLANT
SUT ETHIBOND 0 (SUTURE) IMPLANT
SUT VIC AB 4-0 P-3 18XBRD (SUTURE) IMPLANT
SUT VIC AB 4-0 P3 18 (SUTURE)
SYR CONTROL 10ML LL (SYRINGE) ×3 IMPLANT
TOWEL OR 17X24 6PK STRL BLUE (TOWEL DISPOSABLE) ×3 IMPLANT
TRAY DSU PREP LF (CUSTOM PROCEDURE TRAY) ×3 IMPLANT
TUBE CONNECTING 12'X1/4 (SUCTIONS) ×1
TUBE CONNECTING 12X1/4 (SUCTIONS) ×2 IMPLANT
YANKAUER SUCT BULB TIP NO VENT (SUCTIONS) ×3 IMPLANT

## 2016-11-24 NOTE — Discharge Instructions (Addendum)

## 2016-11-24 NOTE — H&P (Signed)
The patient is a 40 year old female who presents with a complaint of anal problems. Patient complains of approximately 1 year of intermittent anal abscesses. She states that these occasionally are associated with bloody bowel movements. Prior to this she has never had any other episodes. She does endorse a history of hydradenitis in other areas. She underwent exam under anesthesia in April 2017. No fistula could be found. The area was excised and pathology revealed chronic inflammation. She continues to have anal drainage and pain since that time.   Problem List/Past Medical Leighton Ruff, MD; 0000000 4:36 PM) ANAL FISTULA (K60.3)  Other Problems Leighton Ruff, MD; 0000000 4:36 PM) No pertinent past medical history  Past Surgical History Leighton Ruff, MD; 0000000 4:36 PM) RECTAL EUA [phone]) excision of chronic perianal mass Hemorrhoidectomy Hysterectomy (not due to cancer) - Partial  Diagnostic Studies History Leighton Ruff, MD; 0000000 4:36 PM) Mammogram never Pap Smear 1-5 years ago Colonoscopy never  Allergies Mammie Lorenzo, LPN; [id number] PM) No Known Drug Allergies 02/14/2016  Medication History Mammie Lorenzo, LPN; [id number] PM) No Current Medications Medications Reconciled  Social History Leighton Ruff, MD; 0000000 4:36 PM) Tobacco use Former smoker. No drug use No alcohol use No caffeine use  Family History Leighton Ruff, MD; 0000000 4:36 PM) Hypertension Mother. Diabetes Mellitus Mother.  Pregnancy / Birth History Leighton Ruff, MD; 0000000 4:36 PM) Age at menarche 89 years. Para 2 Maternal age 19-20 Gravida 4 Irregular periods     Review of Systems  General Not Present- Appetite Loss, Chills, Fatigue, Fever, Night Sweats, Weight Gain and Weight Loss. Skin Present- Non-Healing Wounds. Not Present- Change in Wart/Mole, Dryness, Hives, Jaundice, New Lesions, Rash and Ulcer. HEENT Not  Present- Earache, Hearing Loss, Hoarseness, Nose Bleed, Oral Ulcers, Ringing in the Ears, Seasonal Allergies, Sinus Pain, Sore Throat, Visual Disturbances, Wears glasses/contact lenses and Yellow Eyes. Respiratory Not Present- Bloody sputum, Chronic Cough, Difficulty Breathing, Snoring and Wheezing. Breast Not Present- Breast Mass, Breast Pain, Nipple Discharge and Skin Changes. Cardiovascular Not Present- Chest Pain, Difficulty Breathing Lying Down, Leg Cramps, Palpitations, Rapid Heart Rate, Shortness of Breath and Swelling of Extremities. Gastrointestinal Present- Abdominal Pain and Hemorrhoids. Not Present- Bloating, Bloody Stool, Change in Bowel Habits, Chronic diarrhea, Constipation, Difficulty Swallowing, Excessive gas, Gets full quickly at meals, Indigestion, Nausea, Rectal Pain and Vomiting. Female Genitourinary Not Present- Frequency, Nocturia, Painful Urination, Pelvic Pain and Urgency. Musculoskeletal Not Present- Back Pain, Joint Pain, Joint Stiffness, Muscle Pain, Muscle Weakness and Swelling of Extremities. Neurological Not Present- Decreased Memory, Fainting, Headaches, Numbness, Seizures, Tingling, Tremor, Trouble walking and Weakness. Psychiatric Not Present- Anxiety, Bipolar, Change in Sleep Pattern, Depression, Fearful and Frequent crying. Endocrine Present- Hot flashes. Not Present- Cold Intolerance, Excessive Hunger, Hair Changes, Heat Intolerance and New Diabetes. Hematology Not Present- Easy Bruising, Excessive bleeding, Gland problems, HIV and Persistent Infections.   BP 132/77   Pulse 81   Temp 98.8 F (37.1 C) (Oral)   Resp 16   Ht 5\' 9"  (1.753 m)   Wt 104.3 kg (230 lb)   SpO2 100%   BMI 33.97 kg/m    Physical Exam   General Mental Status-Alert. General Appearance-Not in acute distress. Build & Nutrition-Well nourished. Posture-Normal posture. Gait-Normal.  Head and Neck Head-normocephalic, atraumatic with no lesions or palpable  masses. Trachea-midline.  Chest and Lung Exam Chest and lung exam reveals -on auscultation, normal breath sounds, no adventitious sounds and normal vocal resonance.  Cardiovascular Cardiovascular examination reveals -normal heart sounds, regular rate and  rhythm with no murmurs.  Abdomen Inspection Inspection of the abdomen reveals - No Hernias. Palpation/Percussion Palpation and Percussion of the abdomen reveal - Soft, Non Tender, No Rigidity (guarding), No hepatosplenomegaly and No Palpable abdominal masses.  Rectal Anorectal Exam External - Note: Left lateral anal mass. Fistula probe inserted and traverses toward the anal canal.  Neurologic Neurologic evaluation reveals -alert and oriented x 3 with no impairment of recent or remote memory, normal attention span and ability to concentrate, normal sensation and normal coordination.  Musculoskeletal Normal Exam - Bilateral-Upper Extremity Strength Normal and Lower Extremity Strength Normal.    Assessment & Plan  ANAL FISTULA (K60.3) Impression: 40 year old female who presents to the office after exam under anesthesia with resection of an anal canal mass in April 2017. During that examination she had no signs of fistula. She continues to have chronic pain. On exam today she has a left-sided anal mass which is draining fluid. The fistula probe traverses this easily into the anal canal. I have recommended an exam under anesthesia with possible fistulotomy versus seton placement.

## 2016-11-24 NOTE — Anesthesia Postprocedure Evaluation (Signed)
Anesthesia Post Note  Patient: Kendra Vasquez  Procedure(s) Performed: Procedure(s) (LRB): ANAL EXAM UNDER ANESTHESIA, FISTULOTOMY (N/A)  Patient location during evaluation: PACU Anesthesia Type: MAC Level of consciousness: awake and alert, oriented and patient cooperative Pain management: pain level controlled Vital Signs Assessment: post-procedure vital signs reviewed and stable Respiratory status: spontaneous breathing, nonlabored ventilation and respiratory function stable Cardiovascular status: blood pressure returned to baseline and stable (pt to resume her BP meds at home) Postop Assessment: no signs of nausea or vomiting Anesthetic complications: no    Last Vitals:  Vitals:   11/24/16 1545 11/24/16 1555  BP: 140/90 (!) 138/91  Pulse: 77 69  Resp: 13 13  Temp:      Last Pain:  Vitals:   11/24/16 1316  TempSrc:   PainSc: 3                  Kimm Sider,E. Buffi Ewton

## 2016-11-24 NOTE — Anesthesia Preprocedure Evaluation (Signed)
Anesthesia Evaluation  Patient identified by MRN, date of birth, ID band Patient awake    Reviewed: Allergy & Precautions, NPO status , Patient's Chart, lab work & pertinent test results  Airway Mallampati: I       Dental  (+) Teeth Intact   Pulmonary former smoker,    breath sounds clear to auscultation       Cardiovascular hypertension,  Rhythm:Regular Rate:Normal     Neuro/Psych    GI/Hepatic negative GI ROS, Neg liver ROS,   Endo/Other  Morbid obesity  Renal/GU negative Renal ROS     Musculoskeletal   Abdominal   Peds  Hematology   Anesthesia Other Findings   Reproductive/Obstetrics                             Anesthesia Physical Anesthesia Plan  ASA: II  Anesthesia Plan: MAC   Post-op Pain Management:    Induction: Intravenous  Airway Management Planned: Simple Face Mask and Natural Airway  Additional Equipment:   Intra-op Plan:   Post-operative Plan:   Informed Consent: I have reviewed the patients History and Physical, chart, labs and discussed the procedure including the risks, benefits and alternatives for the proposed anesthesia with the patient or authorized representative who has indicated his/her understanding and acceptance.   Dental advisory given  Plan Discussed with:   Anesthesia Plan Comments:         Anesthesia Quick Evaluation

## 2016-11-24 NOTE — Op Note (Signed)
11/24/2016  3:06 PM  PATIENT:  Kendra Vasquez  40 y.o. female  Patient Care Team: Pcp Not In System as PCP - General  PRE-OPERATIVE DIAGNOSIS:  ANAL FISTULA  POST-OPERATIVE DIAGNOSIS:  ANAL FISTULA  PROCEDURE:  ANAL EXAM UNDER ANESTHESIA, FISTULOTOMY   Surgeon(s): Leighton Ruff, MD  ASSISTANT: none   ANESTHESIA:   local and MAC  SPECIMEN:  No Specimen  DISPOSITION OF SPECIMEN:  N/A  COUNTS:  YES  PLAN OF CARE: Discharge to home after PACU  PATIENT DISPOSITION:  PACU - hemodynamically stable.  INDICATION: 40 year old female who presents to the office with complaints of persistent perianal pain and drainage. On exam she had signs and symptoms consistent of a left lateral anal fistula.   OR FINDINGS: Left lateral anal fistula involving a small amount of internal sphincter.  DESCRIPTION: the patient was identified in the preoperative holding area and taken to the OR where they were laid on the operating room table.  Mac anesthesia was induced without difficulty. The patient was then positioned in prone jackknife position with buttocks gently taped apart.  The patient was then prepped and draped in usual sterile fashion.  SCDs were noted to be in place prior to the initiation of anesthesia. A surgical timeout was performed indicating the correct patient, procedure, positioning and need for preoperative antibiotics.  A rectal block was performed using Marcaine with epinephrine.    I began with a digital rectal exam.  There were no obvious masses palpated.  I then placed a Hill-Ferguson anoscope into the anal canal and evaluated this completely.  Her anal canal was relatively normal. She does have some external skin tags noted. Minimal internal hemorrhoid disease noted. I placed and initiate fistula probe into the anal canal and the external opening. This entered the anal canal distal to the dentate line. There was a small amount of overlying internal smelter. Fistulotomy was  performed electrocautery. The edges were marsupialized with a 3-0 chromic suture. Hemostasis was good at the end of the procedure. Lidocaine ointment and a sterile dressing were applied. The patient was awakened from anesthesia and sent to the postanesthesia care unit in stable condition. All counts were correct per operating room staff.

## 2016-11-24 NOTE — Anesthesia Procedure Notes (Signed)
Procedure Name: MAC Date/Time: 11/24/2016 2:36 PM Performed by: Justice Rocher Pre-anesthesia Checklist: Patient identified, Timeout performed, Emergency Drugs available, Suction available and Patient being monitored Patient Re-evaluated:Patient Re-evaluated prior to inductionOxygen Delivery Method: Simple face mask Preoxygenation: Pre-oxygenation with 100% oxygen Intubation Type: IV induction Placement Confirmation: positive ETCO2 and breath sounds checked- equal and bilateral

## 2016-11-24 NOTE — Transfer of Care (Addendum)
Immediate Anesthesia Transfer of Care Note  Patient: Kendra Vasquez  Procedure(s) Performed: Procedure(s) (LRB): ANAL EXAM UNDER ANESTHESIA, FISTULOTOMY (N/A)  Patient Location: PACU  Anesthesia Type: MAC  Level of Consciousness: awake, sedated, patient cooperative and responds to stimulation  Airway & Oxygen Therapy: Patient Spontanous Breathing on room air Assest-op Assessment: Report given to PACU RN, Post -op Vital signs reviewed and stable and Patient moving all extremities  Post vital signs: Reviewed and stable  Complications: No apparent anesthesia complications

## 2016-12-09 ENCOUNTER — Encounter (HOSPITAL_COMMUNITY): Payer: Self-pay | Admitting: Emergency Medicine

## 2016-12-09 ENCOUNTER — Emergency Department (HOSPITAL_COMMUNITY)
Admission: EM | Admit: 2016-12-09 | Discharge: 2016-12-10 | Disposition: A | Discharge status: Home / Self Care | Payer: Medicaid Other | Attending: Emergency Medicine | Admitting: Emergency Medicine

## 2016-12-09 DIAGNOSIS — Z87891 Personal history of nicotine dependence: Secondary | ICD-10-CM | POA: Diagnosis not present

## 2016-12-09 DIAGNOSIS — T50905A Adverse effect of unspecified drugs, medicaments and biological substances, initial encounter: Secondary | ICD-10-CM

## 2016-12-09 DIAGNOSIS — I10 Essential (primary) hypertension: Secondary | ICD-10-CM | POA: Insufficient documentation

## 2016-12-09 DIAGNOSIS — T502X5A Adverse effect of carbonic-anhydrase inhibitors, benzothiadiazides and other diuretics, initial encounter: Secondary | ICD-10-CM | POA: Diagnosis not present

## 2016-12-09 DIAGNOSIS — E876 Hypokalemia: Secondary | ICD-10-CM | POA: Diagnosis present

## 2016-12-09 LAB — BASIC METABOLIC PANEL
Anion gap: 9 (ref 5–15)
BUN: 16 mg/dL (ref 6–20)
CO2: 26 mmol/L (ref 22–32)
Calcium: 9.7 mg/dL (ref 8.9–10.3)
Chloride: 104 mmol/L (ref 101–111)
Creatinine, Ser: 0.92 mg/dL (ref 0.44–1.00)
GFR calc Af Amer: >60 mL/min (ref >60)
GFR calc non Af Amer: >60 mL/min (ref >60)
Glucose, Bld: 105 mg/dL — ABNORMAL HIGH (ref 65–99)
Potassium: 2.8 mmol/L — ABNORMAL LOW (ref 3.5–5.1)
Sodium: 139 mmol/L (ref 135–145)

## 2016-12-09 LAB — CBC WITH DIFFERENTIAL/PLATELET
Basophils Absolute: 0 10*3/uL (ref 0.0–0.1)
Basophils Relative: 0 %
Eosinophils Absolute: 0.2 10*3/uL (ref 0.0–0.7)
Eosinophils Relative: 2 %
HCT: 41.7 % (ref 36.0–46.0)
Hemoglobin: 13.8 g/dL (ref 12.0–15.0)
Lymphocytes Relative: 49 %
Lymphs Abs: 4.5 10*3/uL — ABNORMAL HIGH (ref 0.7–4.0)
MCH: 28.8 pg (ref 26.0–34.0)
MCHC: 33.1 g/dL (ref 30.0–36.0)
MCV: 86.9 fL (ref 78.0–100.0)
Monocytes Absolute: 0.5 10*3/uL (ref 0.1–1.0)
Monocytes Relative: 5 %
Neutro Abs: 4.1 10*3/uL (ref 1.7–7.7)
Neutrophils Relative %: 44 %
Platelets: 280 10*3/uL (ref 150–400)
RBC: 4.8 MIL/uL (ref 3.87–5.11)
RDW: 12.6 % (ref 11.5–15.5)
WBC: 9.3 10*3/uL (ref 4.0–10.5)

## 2016-12-09 LAB — URINALYSIS, ROUTINE W REFLEX MICROSCOPIC
Bilirubin Urine: NEGATIVE
Glucose, UA: NEGATIVE mg/dL
Hgb urine dipstick: NEGATIVE
Ketones, ur: 5 mg/dL — AB
Leukocytes, UA: NEGATIVE
Nitrite: NEGATIVE
Protein, ur: NEGATIVE mg/dL
Specific Gravity, Urine: 1.03 (ref 1.005–1.030)
pH: 5 (ref 5.0–8.0)

## 2016-12-09 LAB — TROPONIN I: Troponin I: <0.03 ng/mL (ref <0.03)

## 2016-12-09 MED ORDER — KETOROLAC TROMETHAMINE 30 MG/ML IJ SOLN
30.0000 mg | Freq: Once | INTRAMUSCULAR | Status: AC
  Administered 2016-12-09: 30 mg via INTRAVENOUS
  Filled 2016-12-09: qty 1

## 2016-12-09 MED ORDER — POTASSIUM CHLORIDE CRYS ER 20 MEQ PO TBCR: 20.0000 meq | EXTENDED_RELEASE_TABLET | Freq: Two times a day (BID) | ORAL | 0 refills | Status: DC

## 2016-12-09 MED ORDER — SODIUM CHLORIDE 0.9 % IV BOLUS (SEPSIS)
1000.0000 mL | Freq: Once | INTRAVENOUS | Status: AC
  Administered 2016-12-09: 1000 mL via INTRAVENOUS

## 2016-12-09 MED ORDER — POTASSIUM CHLORIDE CRYS ER 20 MEQ PO TBCR
40.0000 meq | EXTENDED_RELEASE_TABLET | Freq: Once | ORAL | Status: AC
  Administered 2016-12-09: 40 meq via ORAL
  Filled 2016-12-09: qty 2

## 2016-12-09 MED ORDER — MAGNESIUM SULFATE 2 GM/50ML IV SOLN
2.0000 g | Freq: Once | INTRAVENOUS | Status: AC
  Administered 2016-12-09: 2 g via INTRAVENOUS
  Filled 2016-12-09: qty 50
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
## 2016-12-09 NOTE — ED Notes (Addendum)
Pt would like all her medical information to be confindential and no medical information to be discussed in front of any family member or visitor.  Pt expresses to staff that she wants her female visitor at the bedside but to make sure no information is disclosed to him.  Advised pt that all medical information is confidential but to ensure her privacy fully she should not have visitors in her room.

## 2016-12-09 NOTE — ED Triage Notes (Signed)
Pt states that she started taking BP meds recently and now is "not feeling right" -- started taking meds on Wednesday-- has been taking both meds at night,  Pt has a "new friend" and has made herself a XXX -- does not want anything from history discussed when he is in room. States it is ok to talk about today's complaint with friend in room.

## 2017-02-06 ENCOUNTER — Emergency Department (HOSPITAL_COMMUNITY)
Admission: EM | Admit: 2017-02-06 | Discharge: 2017-02-06 | Disposition: A | Discharge status: Home / Self Care | Payer: Medicaid Other | Attending: Emergency Medicine | Admitting: Emergency Medicine

## 2017-02-06 ENCOUNTER — Encounter (HOSPITAL_COMMUNITY): Payer: Self-pay

## 2017-02-06 ENCOUNTER — Emergency Department (HOSPITAL_COMMUNITY): Payer: Medicaid Other

## 2017-02-06 DIAGNOSIS — Z87891 Personal history of nicotine dependence: Secondary | ICD-10-CM | POA: Insufficient documentation

## 2017-02-06 DIAGNOSIS — K5900 Constipation, unspecified: Secondary | ICD-10-CM | POA: Diagnosis not present

## 2017-02-06 DIAGNOSIS — R05 Cough: Secondary | ICD-10-CM | POA: Insufficient documentation

## 2017-02-06 DIAGNOSIS — R059 Cough, unspecified: Secondary | ICD-10-CM

## 2017-02-06 DIAGNOSIS — I10 Essential (primary) hypertension: Secondary | ICD-10-CM | POA: Insufficient documentation

## 2017-02-06 DIAGNOSIS — Z79899 Other long term (current) drug therapy: Secondary | ICD-10-CM | POA: Diagnosis not present

## 2017-02-06 LAB — POC OCCULT BLOOD, ED: Fecal Occult Bld: NEGATIVE

## 2017-02-06 LAB — POC URINE PREG, ED: Preg Test, Ur: NEGATIVE

## 2017-02-06 MED ORDER — BENZONATATE 100 MG PO CAPS: 100.0000 mg | ORAL_CAPSULE | Freq: Three times a day (TID) | ORAL | 0 refills | Status: DC

## 2017-02-06 MED ORDER — ONDANSETRON HCL 4 MG PO TABS: 4.0000 mg | ORAL_TABLET | Freq: Four times a day (QID) | ORAL | 0 refills | Status: DC

## 2017-02-06 MED ORDER — POLYETHYLENE GLYCOL 3350 17 G PO PACK
17.0000 g | PACK | Freq: Every day | ORAL | Status: DC
  Administered 2017-02-06: 17 g via ORAL
  Filled 2017-02-06: qty 1

## 2017-02-06 MED ORDER — PREDNISONE 10 MG (21) PO TBPK: 10.0000 mg | ORAL_TABLET | Freq: Every day | ORAL | 0 refills | Status: DC

## 2017-02-06 MED ORDER — DOCUSATE SODIUM 250 MG PO CAPS: 250.0000 mg | ORAL_CAPSULE | Freq: Every day | ORAL | 0 refills | Status: DC

## 2017-02-06 MED ORDER — POLYETHYLENE GLYCOL 3350 17 G PO PACK: 17.0000 g | PACK | Freq: Every day | ORAL | 0 refills | Status: DC

## 2017-02-06 MED ORDER — ALBUTEROL SULFATE HFA 108 (90 BASE) MCG/ACT IN AERS: 1.0000 | INHALATION_SPRAY | Freq: Four times a day (QID) | RESPIRATORY_TRACT | 0 refills | Status: DC | PRN

## 2017-02-06 NOTE — ED Triage Notes (Signed)
Patient complains of dry cough for weeks, states that she has no runny nose or congestion, smoker. Also states that she may need something for decreased bowel movements, taking stool softner and going to bathroom but not as regular, aNAD
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
## 2017-02-06 NOTE — ED Notes (Signed)
Patient transported to X-ray 

## 2017-02-06 NOTE — ED Notes (Signed)
Pt statse she understands instructions. Home stable with steady gait.

## 2017-02-06 NOTE — ED Notes (Signed)
oob to br with steady gait

## 2017-02-06 NOTE — Discharge Instructions (Signed)
Medications: Prednisone, Tessalon, albuterol inhaler, Zofran, MiraLAX, Colace  Treatment: Take prednisone until completed. Take Tessalon every 8 hours as needed for cough. Use albuterol inhaler every 6 hours as needed for cough, wheezing, or shortness of breath. You can take MiraLAX up to 4 days in a row to produce a bowel movement. Take Colace once daily. Take Zofran every 6 hours as needed for nausea or vomiting.  Follow-up: Please follow-up with your primary care provider for follow-up of today's visit or if your symptoms are not improving. Please return to the emergency department if you develop any new or worsening symptoms.

## 2017-02-13 ENCOUNTER — Emergency Department (HOSPITAL_COMMUNITY)
Admission: EM | Admit: 2017-02-13 | Discharge: 2017-02-13 | Disposition: A | Discharge status: Home / Self Care | Payer: Medicaid Other | Attending: Emergency Medicine | Admitting: Emergency Medicine

## 2017-02-13 ENCOUNTER — Encounter (HOSPITAL_COMMUNITY): Payer: Self-pay | Admitting: Emergency Medicine

## 2017-02-13 ENCOUNTER — Emergency Department (HOSPITAL_COMMUNITY): Payer: Medicaid Other

## 2017-02-13 DIAGNOSIS — B349 Viral infection, unspecified: Secondary | ICD-10-CM | POA: Insufficient documentation

## 2017-02-13 DIAGNOSIS — R05 Cough: Secondary | ICD-10-CM | POA: Diagnosis present

## 2017-02-13 DIAGNOSIS — Z87891 Personal history of nicotine dependence: Secondary | ICD-10-CM | POA: Insufficient documentation

## 2017-02-13 DIAGNOSIS — I10 Essential (primary) hypertension: Secondary | ICD-10-CM | POA: Diagnosis not present

## 2017-02-13 DIAGNOSIS — Z79899 Other long term (current) drug therapy: Secondary | ICD-10-CM | POA: Diagnosis not present

## 2017-02-13 DIAGNOSIS — R059 Cough, unspecified: Secondary | ICD-10-CM

## 2017-02-13 LAB — COMPREHENSIVE METABOLIC PANEL
ALT: 10 U/L — ABNORMAL LOW (ref 14–54)
AST: 16 U/L (ref 15–41)
Albumin: 3.7 g/dL (ref 3.5–5.0)
Alkaline Phosphatase: 53 U/L (ref 38–126)
Anion gap: 4 — ABNORMAL LOW (ref 5–15)
BUN: 11 mg/dL (ref 6–20)
CO2: 28 mmol/L (ref 22–32)
Calcium: 8.9 mg/dL (ref 8.9–10.3)
Chloride: 108 mmol/L (ref 101–111)
Creatinine, Ser: 1 mg/dL (ref 0.44–1.00)
GFR calc Af Amer: >60 mL/min (ref >60)
GFR calc non Af Amer: >60 mL/min (ref >60)
Glucose, Bld: 102 mg/dL — ABNORMAL HIGH (ref 65–99)
Potassium: 3.8 mmol/L (ref 3.5–5.1)
Sodium: 140 mmol/L (ref 135–145)
Total Bilirubin: 0.4 mg/dL (ref 0.3–1.2)
Total Protein: 6.6 g/dL (ref 6.5–8.1)

## 2017-02-13 LAB — CBC
HCT: 38.4 % (ref 36.0–46.0)
Hemoglobin: 12.8 g/dL (ref 12.0–15.0)
MCH: 29.4 pg (ref 26.0–34.0)
MCHC: 33.3 g/dL (ref 30.0–36.0)
MCV: 88.1 fL (ref 78.0–100.0)
Platelets: 239 10*3/uL (ref 150–400)
RBC: 4.36 MIL/uL (ref 3.87–5.11)
RDW: 12.7 % (ref 11.5–15.5)
WBC: 5.3 10*3/uL (ref 4.0–10.5)

## 2017-02-13 LAB — URINALYSIS, ROUTINE W REFLEX MICROSCOPIC
Bilirubin Urine: NEGATIVE
Glucose, UA: NEGATIVE mg/dL
Hgb urine dipstick: NEGATIVE
Ketones, ur: NEGATIVE mg/dL
Leukocytes, UA: NEGATIVE
Nitrite: NEGATIVE
Protein, ur: NEGATIVE mg/dL
Specific Gravity, Urine: 1.018 (ref 1.005–1.030)
pH: 6 (ref 5.0–8.0)

## 2017-02-13 LAB — LIPASE, BLOOD: Lipase: 18 U/L (ref 11–51)

## 2017-02-13 LAB — POC URINE PREG, ED: Preg Test, Ur: NEGATIVE

## 2017-02-13 MED ORDER — OXYMETAZOLINE HCL 0.05 % NA SOLN
1.0000 | Freq: Two times a day (BID) | NASAL | Status: DC
  Administered 2017-02-13: 1 via NASAL
  Filled 2017-02-13: qty 15

## 2017-02-13 MED ORDER — ONDANSETRON HCL 4 MG/2ML IJ SOLN
4.0000 mg | Freq: Once | INTRAMUSCULAR | Status: DC
  Filled 2017-02-13: qty 2

## 2017-02-13 MED ORDER — SODIUM CHLORIDE 0.9 % IV BOLUS (SEPSIS)
1000.0000 mL | Freq: Once | INTRAVENOUS | Status: AC
  Administered 2017-02-13: 1000 mL via INTRAVENOUS

## 2017-02-13 MED ORDER — CETIRIZINE HCL 10 MG PO TABS: 10.0000 mg | ORAL_TABLET | Freq: Every day | ORAL | 0 refills | Status: DC

## 2017-02-13 NOTE — Discharge Instructions (Signed)
Start taking Zyrtec daily. Decrease your smoking as much as possible. You may use Afrin up to twice daily but discontinue after 3 days. Follow-up with your primary care provider regarding your cough.
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
## 2017-02-13 NOTE — ED Triage Notes (Signed)
Patient states that she was seen week ago at [facility] ED for same symptoms but still having congestion, vomiting, body aches even after taking medications. Patient vomited 2 times in past 24 hours.

## 2017-08-28 ENCOUNTER — Ambulatory Visit (HOSPITAL_COMMUNITY)
Admission: EM | Admit: 2017-08-28 | Discharge: 2017-08-28 | Disposition: A | Discharge status: Home / Self Care | Payer: Medicaid Other | Attending: Family Medicine | Admitting: Family Medicine

## 2017-08-28 ENCOUNTER — Encounter (HOSPITAL_COMMUNITY): Payer: Self-pay | Admitting: Emergency Medicine

## 2017-08-28 DIAGNOSIS — S80862A Insect bite (nonvenomous), left lower leg, initial encounter: Secondary | ICD-10-CM | POA: Diagnosis not present

## 2017-08-28 DIAGNOSIS — L299 Pruritus, unspecified: Secondary | ICD-10-CM | POA: Diagnosis not present

## 2017-08-28 DIAGNOSIS — S80861A Insect bite (nonvenomous), right lower leg, initial encounter: Secondary | ICD-10-CM

## 2017-08-28 DIAGNOSIS — W57XXXA Bitten or stung by nonvenomous insect and other nonvenomous arthropods, initial encounter: Secondary | ICD-10-CM | POA: Diagnosis not present

## 2017-08-28 MED ORDER — PREDNISONE 10 MG (21) PO TBPK: ORAL_TABLET | ORAL | 0 refills | Status: DC

## 2017-08-28 NOTE — ED Notes (Signed)
No answer

## 2017-08-28 NOTE — ED Triage Notes (Signed)
PT has multiple bug bites on legs. PT believes she may have gotten them at work
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
## 2018-04-15 IMAGING — DX DG ABDOMEN 1V
2 series · 2 of 2 positions shown · non-contrast
Comparison: 11/02/2014

CLINICAL DATA: Constipation for 3 days with nausea.

EXAM:
ABDOMEN - 1 VIEW

[abdomen kub (1 of 2)]
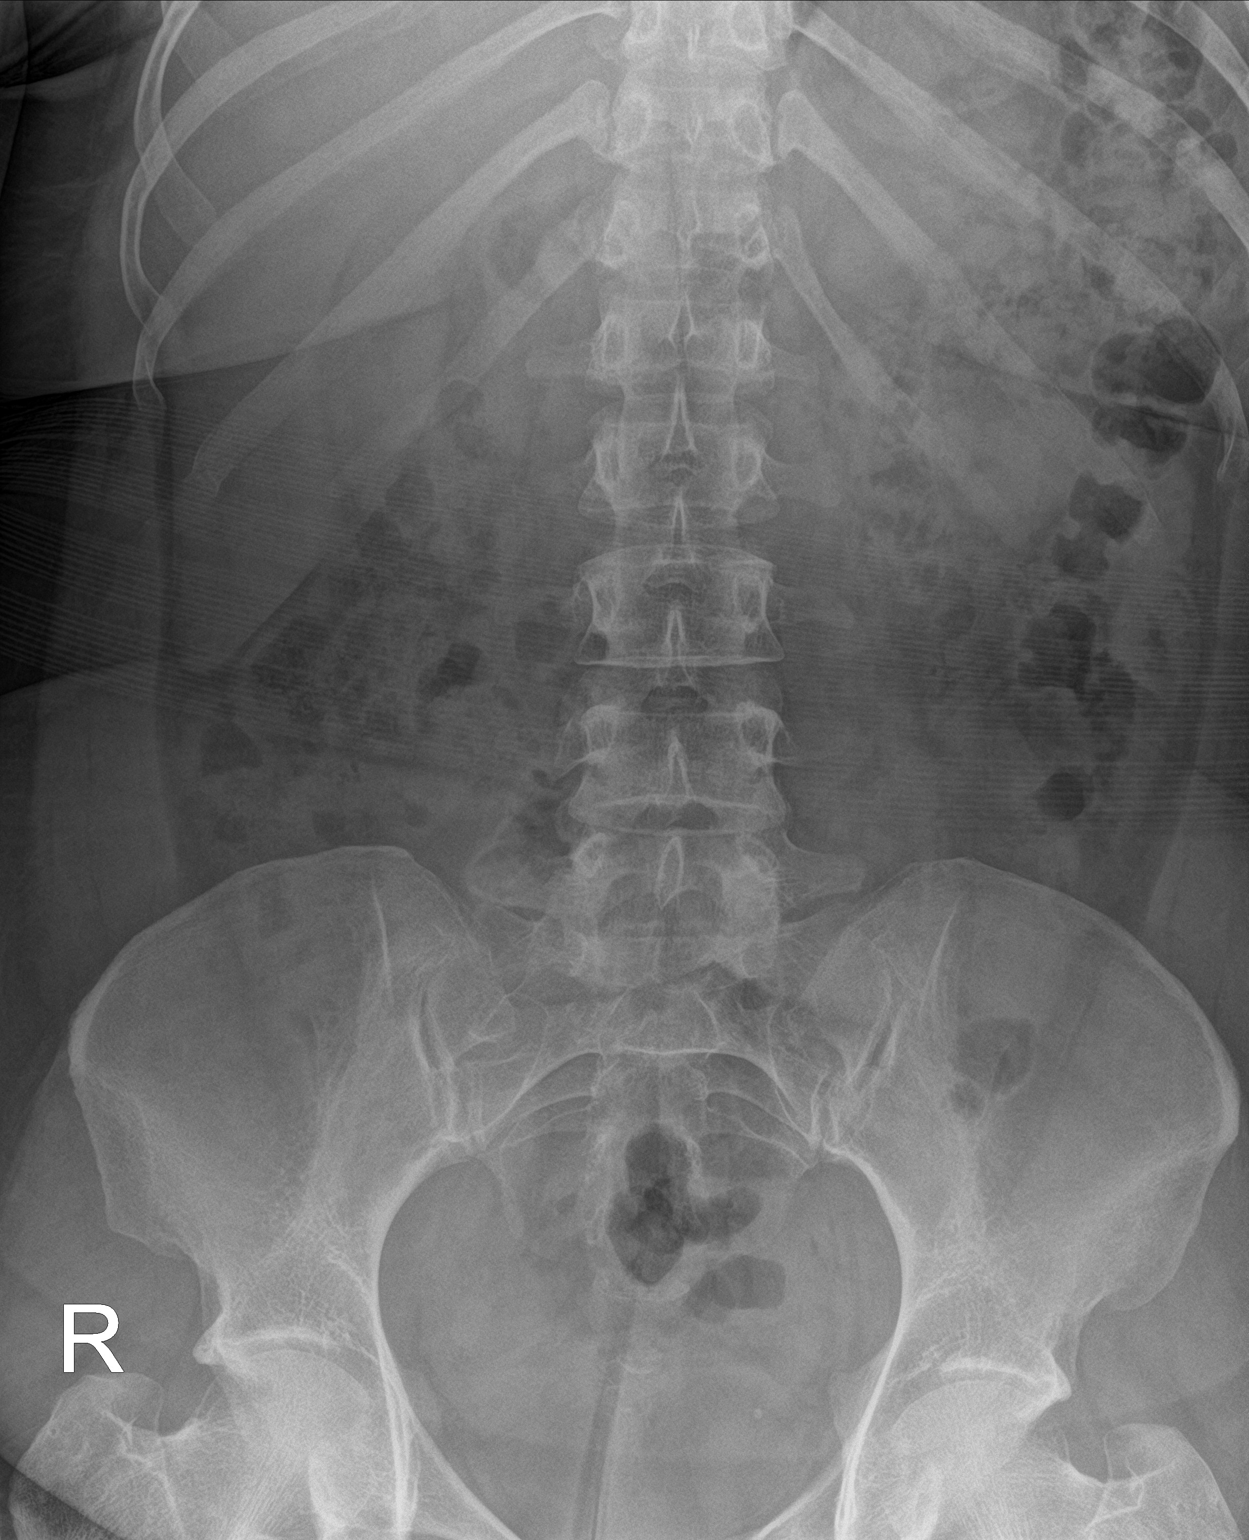

[abdomen kub (2 of 2)]
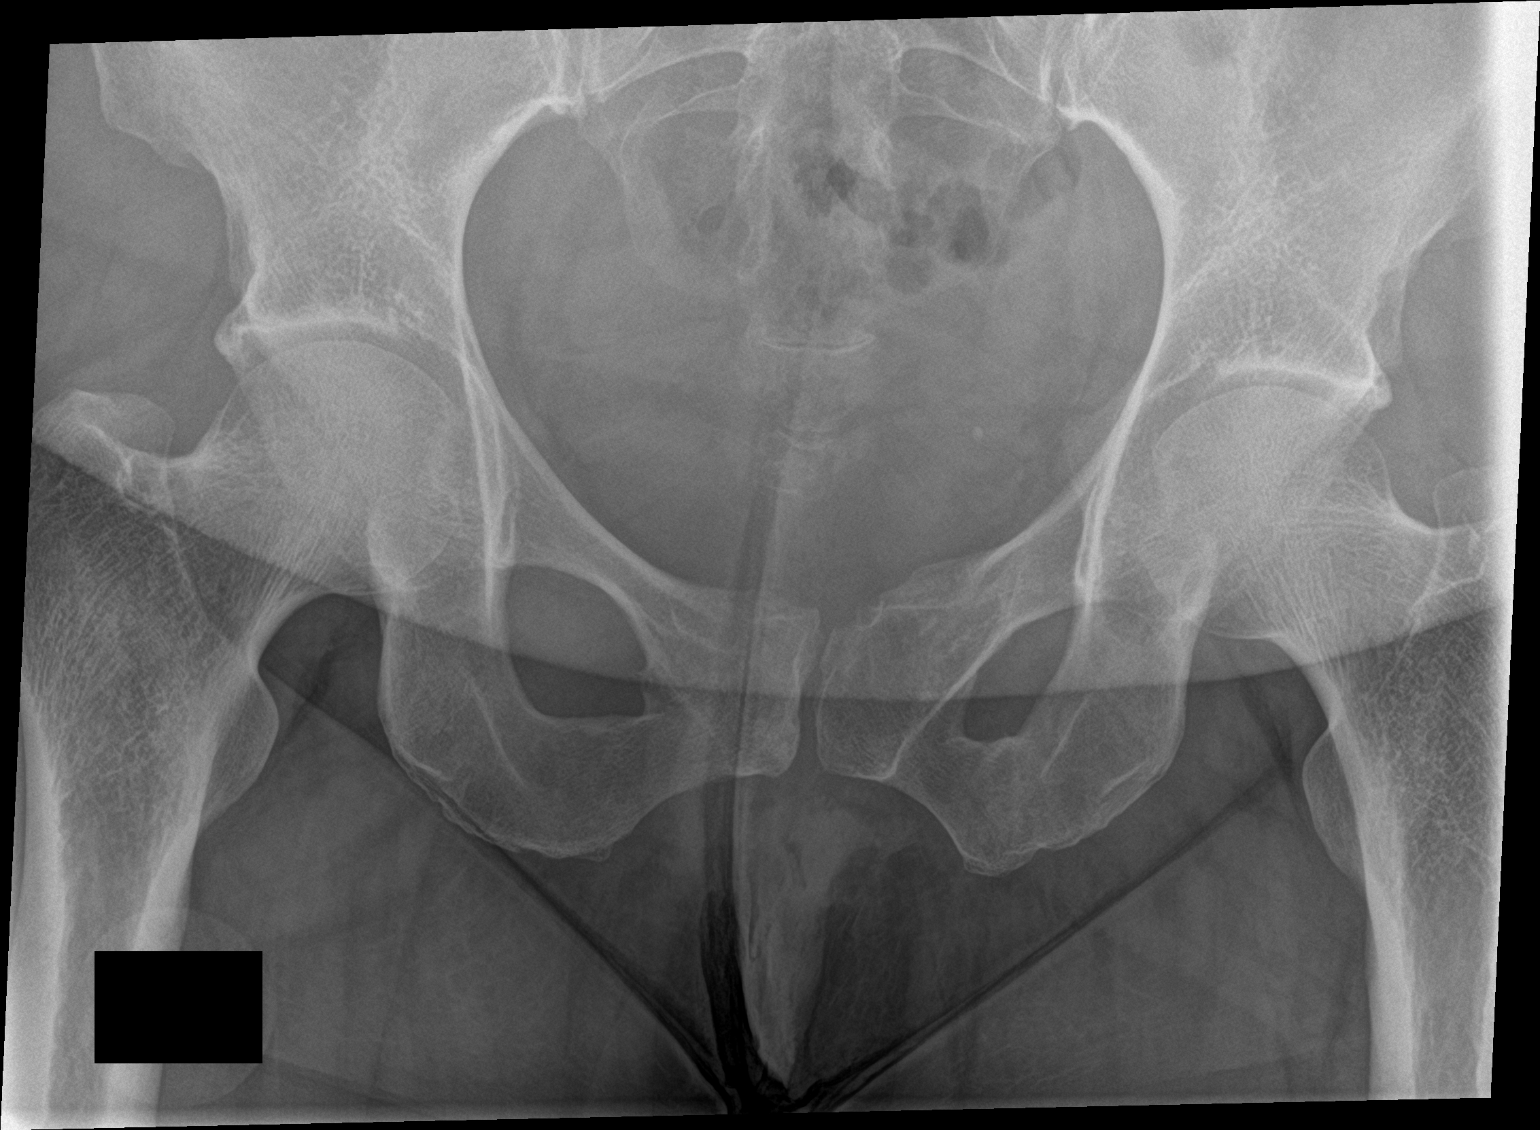

[2 of 2 positions shown; findings below may reference images not displayed]

FINDINGS: The bowel gas pattern is normal. No radio-opaque calculi or other
significant radiographic abnormality are seen.
IMPRESSION: Negative.

## 2020-09-15 DIAGNOSIS — K625 Hemorrhage of anus and rectum: Secondary | ICD-10-CM | POA: Insufficient documentation

## 2020-10-08 DIAGNOSIS — K219 Gastro-esophageal reflux disease without esophagitis: Secondary | ICD-10-CM | POA: Insufficient documentation

## 2020-11-11 ENCOUNTER — Emergency Department (HOSPITAL_COMMUNITY): Payer: BLUE CROSS/BLUE SHIELD

## 2020-11-11 ENCOUNTER — Encounter (HOSPITAL_COMMUNITY): Payer: Self-pay | Admitting: Emergency Medicine

## 2020-11-11 ENCOUNTER — Emergency Department (HOSPITAL_COMMUNITY)
Admission: EM | Admit: 2020-11-11 | Discharge: 2020-11-11 | Disposition: A | Discharge status: Home / Self Care | Payer: BLUE CROSS/BLUE SHIELD | Attending: Emergency Medicine | Admitting: Emergency Medicine

## 2020-11-11 ENCOUNTER — Other Ambulatory Visit: Payer: Self-pay

## 2020-11-11 DIAGNOSIS — M546 Pain in thoracic spine: Secondary | ICD-10-CM | POA: Diagnosis not present

## 2020-11-11 DIAGNOSIS — Z87891 Personal history of nicotine dependence: Secondary | ICD-10-CM | POA: Diagnosis not present

## 2020-11-11 DIAGNOSIS — I10 Essential (primary) hypertension: Secondary | ICD-10-CM | POA: Insufficient documentation

## 2020-11-11 DIAGNOSIS — R000 Tachycardia, unspecified: Secondary | ICD-10-CM | POA: Diagnosis not present

## 2020-11-11 DIAGNOSIS — M549 Dorsalgia, unspecified: Secondary | ICD-10-CM | POA: Diagnosis present

## 2020-11-11 DIAGNOSIS — Z79899 Other long term (current) drug therapy: Secondary | ICD-10-CM | POA: Insufficient documentation

## 2020-11-11 LAB — CBC WITH DIFFERENTIAL/PLATELET
Abs Immature Granulocytes: 0.02 10*3/uL (ref 0.00–0.07)
Basophils Absolute: 0 10*3/uL (ref 0.0–0.1)
Basophils Relative: 1 %
Eosinophils Absolute: 0.1 10*3/uL (ref 0.0–0.5)
Eosinophils Relative: 2 %
HCT: 39.4 % (ref 36.0–46.0)
Hemoglobin: 13.1 g/dL (ref 12.0–15.0)
Immature Granulocytes: 0 %
Lymphocytes Relative: 48 %
Lymphs Abs: 3 10*3/uL (ref 0.7–4.0)
MCH: 29.8 pg (ref 26.0–34.0)
MCHC: 33.2 g/dL (ref 30.0–36.0)
MCV: 89.7 fL (ref 80.0–100.0)
Monocytes Absolute: 0.5 10*3/uL (ref 0.1–1.0)
Monocytes Relative: 8 %
Neutro Abs: 2.5 10*3/uL (ref 1.7–7.7)
Neutrophils Relative %: 41 %
Platelets: 237 10*3/uL (ref 150–400)
RBC: 4.39 MIL/uL (ref 3.87–5.11)
RDW: 11.9 % (ref 11.5–15.5)
WBC: 6.1 10*3/uL (ref 4.0–10.5)
nRBC: 0 % (ref 0.0–0.2)

## 2020-11-11 LAB — BASIC METABOLIC PANEL
Anion gap: 12 (ref 5–15)
BUN: 19 mg/dL (ref 6–20)
CO2: 17 mmol/L — ABNORMAL LOW (ref 22–32)
Calcium: 9.7 mg/dL (ref 8.9–10.3)
Chloride: 107 mmol/L (ref 98–111)
Creatinine, Ser: 1.26 mg/dL — ABNORMAL HIGH (ref 0.44–1.00)
GFR, Estimated: 55 mL/min — ABNORMAL LOW (ref >60)
Glucose, Bld: 106 mg/dL — ABNORMAL HIGH (ref 70–99)
Potassium: 4.1 mmol/L (ref 3.5–5.1)
Sodium: 136 mmol/L (ref 135–145)

## 2020-11-11 LAB — TROPONIN I (HIGH SENSITIVITY): Troponin I (High Sensitivity): <2 ng/L (ref <18)

## 2020-11-11 MED ORDER — LIDOCAINE 5 % EX PTCH
1.0000 | MEDICATED_PATCH | CUTANEOUS | Status: DC
  Administered 2020-11-11: 1 via TRANSDERMAL
  Filled 2020-11-11: qty 1

## 2020-11-11 MED ORDER — DICLOFENAC SODIUM 1 % EX GEL: 2.0000 g | Freq: Three times a day (TID) | CUTANEOUS | 1 refills | Status: AC | PRN

## 2020-11-11 MED ORDER — LIDOCAINE 5 % EX PTCH: 1.0000 | MEDICATED_PATCH | CUTANEOUS | 0 refills | Status: AC

## 2020-11-11 MED ORDER — FENTANYL CITRATE (PF) 100 MCG/2ML IJ SOLN
100.0000 ug | Freq: Once | INTRAMUSCULAR | Status: AC
  Administered 2020-11-11: 100 ug via INTRAVENOUS
  Filled 2020-11-11: qty 2

## 2020-11-11 MED ORDER — HYDROMORPHONE HCL 1 MG/ML IJ SOLN
1.0000 mg | Freq: Once | INTRAMUSCULAR | Status: AC
  Administered 2020-11-11: 1 mg via INTRAVENOUS
  Filled 2020-11-11: qty 1

## 2020-11-11 MED ORDER — SENNOSIDES-DOCUSATE SODIUM 8.6-50 MG PO TABS
1.0000 | ORAL_TABLET | Freq: Every day | ORAL | 0 refills | Status: AC
Start: 2020-11-11 — End: 2020-12-11

## 2020-11-11 MED ORDER — OXYCODONE HCL 5 MG PO TABS
10.0000 mg | ORAL_TABLET | Freq: Once | ORAL | Status: AC
  Administered 2020-11-11: 10 mg via ORAL
  Filled 2020-11-11: qty 2

## 2020-11-11 MED ORDER — FENTANYL CITRATE (PF) 100 MCG/2ML IJ SOLN: 100.0000 ug | Freq: Once | INTRAMUSCULAR | Status: DC

## 2020-11-11 MED ORDER — KETOROLAC TROMETHAMINE 30 MG/ML IJ SOLN
30.0000 mg | Freq: Once | INTRAMUSCULAR | Status: AC
  Administered 2020-11-11: 30 mg via INTRAVENOUS
  Filled 2020-11-11: qty 1

## 2020-11-11 MED ORDER — DEXAMETHASONE SODIUM PHOSPHATE 10 MG/ML IJ SOLN
10.0000 mg | Freq: Once | INTRAMUSCULAR | Status: AC
  Administered 2020-11-11: 10 mg via INTRAVENOUS
  Filled 2020-11-11: qty 1

## 2020-11-11 MED ORDER — OXYCODONE-ACETAMINOPHEN 10-325 MG PO TABS
1.0000 | ORAL_TABLET | Freq: Four times a day (QID) | ORAL | 0 refills | Status: AC | PRN
Start: 2020-11-11 — End: 2020-11-14

## 2020-11-11 NOTE — ED Notes (Signed)
Pt discharge instructions and prescriptions reviewed with the patient. The patient verbalized understanding of instructions. Pt discharged. 

## 2020-11-11 NOTE — Progress Notes (Signed)
Orthopedic Tech Progress Note Patient Details:  Shreshta Medley [facility] May 01, 1976 [id number] Called in brace Patient ID: Alisa Graff, female   DOB: [birth year], 44 y.o.   MRN: [id number]   Ellouise Newer 11/11/2020, 10:07 AM

## 2020-11-11 NOTE — ED Notes (Signed)
Patient transported to CT 
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
## 2020-11-11 NOTE — ED Triage Notes (Signed)
Pt BIB [facility] from home. Complaint of sudden onset chest pain. Starts in back and radiates to chest. Pt 8/10 pain. Denies n/v. Pt took 324 aspirin at home. Nitro x2 via EMS with no relief. VSS. NAD.

## 2020-11-11 NOTE — Discharge Instructions (Addendum)
Your blood work and scans today did not show any emergency causes of your back pain.  Specifically, we do not see signs of a heart attack, fracture in your spine, collapsed lung, or pneumonia.  I think your pain is most likely coming from a severe muscle cramp or also from the pinched nerve in your thoracic spine.  Your pain was better with lidocaine as well as pain medications in the ER.  I advised to continue using his medications at home for the next week and follow-up with your doctor.  If you are still having significant pain, I would recommend an MRI of the thoracic spine without contrast to look for a herniated disc.  This can be done as an outpatient.

## 2021-02-17 LAB — COMPREHENSIVE METABOLIC PANEL
Albumin: 4.4 (ref 3.5–5.0)
GFR calc Af Amer: 80
GFR calc non Af Amer: 70
Globulin: 2.7

## 2021-02-17 LAB — BASIC METABOLIC PANEL
BUN: 14 (ref 4–21)
CO2: 21 (ref 13–22)
Chloride: 102 (ref 99–108)
Creatinine: 1 (ref 0.5–1.1)
Glucose: 107
Potassium: 4.8 (ref 3.4–5.3)
Sodium: 138 (ref 137–147)

## 2021-02-17 LAB — TSH
TSH: 0.97 (ref 0.41–5.90)
TSH: 0.97 (ref 0.41–5.90)

## 2021-02-17 LAB — HEPATIC FUNCTION PANEL
ALT: 7 (ref 7–35)
AST: 16 (ref 13–35)
Alkaline Phosphatase: 75 (ref 25–125)
Bilirubin, Total: 0.3

## 2021-02-17 LAB — CBC: RBC: 4.58 (ref 3.87–5.11)

## 2021-02-17 LAB — CBC AND DIFFERENTIAL
HCT: 42 (ref 36–46)
Hemoglobin: 13.7 (ref 12.0–16.0)
Neutrophils Absolute: 2.4
Platelets: 270 (ref 150–399)
WBC: 5.6

## 2021-02-17 LAB — VITAMIN D 25 HYDROXY (VIT D DEFICIENCY, FRACTURES): Vit D, 25-Hydroxy: 55.3

## 2021-03-03 ENCOUNTER — Telehealth: Payer: Self-pay | Admitting: Internal Medicine

## 2021-03-03 NOTE — Telephone Encounter (Signed)
Hey Dr Carlean Purl this pt is wishing to establish care with [place] for blood in her stool, pt used to see [facility] 10/2020 but due to insurance changes she is wanting to switch. Records are in epic for review, please advise on scheduling.

## 2021-03-07 NOTE — Telephone Encounter (Signed)
Kendra Vasquez   records reviewed the patient has a history of a rectal carcinoid removal and it looks like she is having rectal bleeding after that  She may be scheduled with me or an advanced practitioner and assigned to me

## 2021-03-09 ENCOUNTER — Encounter: Payer: Self-pay | Admitting: Nurse Practitioner

## 2021-03-15 ENCOUNTER — Telehealth: Payer: Self-pay | Admitting: Dermatology

## 2021-03-15 NOTE — Telephone Encounter (Signed)
Referral from [facility] (?) Doesn't want to wait til end of August for appointment. Please contact referrer + request they try to get her in elsewhere sooner AND they call her to let her know they're trying.

## 2021-03-15 NOTE — Telephone Encounter (Signed)
Notes documented and referral closed. Informed referring office of refusal.
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
## 2021-03-24 ENCOUNTER — Encounter: Payer: Self-pay | Admitting: Nurse Practitioner

## 2021-03-24 ENCOUNTER — Other Ambulatory Visit: Payer: Self-pay

## 2021-03-24 ENCOUNTER — Ambulatory Visit (INDEPENDENT_AMBULATORY_CARE_PROVIDER_SITE_OTHER): Payer: 59 | Admitting: Nurse Practitioner

## 2021-03-24 VITALS — BP 128/80 | HR 66 | Ht 69.0 in | Wt 246.0 lb

## 2021-03-24 DIAGNOSIS — K625 Hemorrhage of anus and rectum: Secondary | ICD-10-CM | POA: Diagnosis not present

## 2021-03-24 DIAGNOSIS — D3A026 Benign carcinoid tumor of the rectum: Secondary | ICD-10-CM

## 2021-03-24 DIAGNOSIS — R1030 Lower abdominal pain, unspecified: Secondary | ICD-10-CM | POA: Diagnosis not present

## 2021-03-24 MED ORDER — SUPREP BOWEL PREP KIT 17.5-3.13-1.6 GM/177ML PO SOLN: 1.0000 | ORAL | 0 refills | Status: DC

## 2021-03-24 NOTE — Patient Instructions (Addendum)
If you are age 43 or younger, your body mass index should be between 19-25. Your Body mass index is 36.33 kg/m. If this is out of the aformentioned range listed, please consider follow up with your Primary Care Provider.   PROCEDURES: You have been scheduled for a colonoscopy. Please follow the written instructions given to you at your visit today. If you use inhalers (even only as needed), please bring them with you on the day of your procedure.    Call us if lower abdominal pain or rectal bleeding recurs. It was great seeing you today! Thank you for entrusting me with your care and choosing [facility].  Noralyn Pick, CRNP

## 2021-05-24 ENCOUNTER — Encounter: Payer: Self-pay | Admitting: Certified Registered Nurse Anesthetist

## 2021-05-25 ENCOUNTER — Other Ambulatory Visit: Payer: Self-pay

## 2021-05-25 ENCOUNTER — Encounter: Payer: Self-pay | Admitting: Internal Medicine

## 2021-05-25 ENCOUNTER — Ambulatory Visit (AMBULATORY_SURGERY_CENTER): Payer: 59 | Admitting: Internal Medicine

## 2021-05-25 VITALS — BP 118/79 | HR 69 | Temp 98.4°F | Resp 13 | Ht 69.0 in | Wt 246.0 lb

## 2021-05-25 DIAGNOSIS — K5730 Diverticulosis of large intestine without perforation or abscess without bleeding: Secondary | ICD-10-CM

## 2021-05-25 DIAGNOSIS — K625 Hemorrhage of anus and rectum: Secondary | ICD-10-CM

## 2021-05-25 DIAGNOSIS — K648 Other hemorrhoids: Secondary | ICD-10-CM | POA: Diagnosis not present

## 2021-05-25 MED ORDER — SODIUM CHLORIDE 0.9 % IV SOLN: 500.0000 mL | Freq: Once | INTRAVENOUS | Status: DC

## 2021-05-25 MED ORDER — HYDROCORTISONE (PERIANAL) 2.5 % EX CREA: 1.0000 "application " | TOPICAL_CREAM | Freq: Two times a day (BID) | CUTANEOUS | 0 refills | Status: AC | PRN

## 2021-05-25 MED ORDER — HYDROCORTISONE (PERIANAL) 2.5 % EX CREA: 1.0000 "application " | TOPICAL_CREAM | Freq: Two times a day (BID) | CUTANEOUS | 1 refills | Status: AC

## 2021-05-25 NOTE — Progress Notes (Signed)
C.W. vital signs. 

## 2021-05-25 NOTE — Op Note (Signed)
[facility] Patient Name: Kendra Vasquez Procedure Date: 05/25/2021 2:53 PM MRN: [id number] Endoscopist: Gatha Mayer , MD Age: 45 Referring MD:  Date of Birth: 07/05/1976 Gender: Female Account #: [id number] Procedure:                Colonoscopy Indications:              Rectal bleeding Medicines:                Propofol per Anesthesia, Monitored Anesthesia Care Procedure:                Pre-Anesthesia Assessment:                           - Prior to the procedure, a History and Physical                            was performed, and patient medications and                            allergies were reviewed. The patient's tolerance of                            previous anesthesia was also reviewed. The risks                            and benefits of the procedure and the sedation                            options and risks were discussed with the patient.                            All questions were answered, and informed consent                            was obtained. Prior Anticoagulants: The patient has                            taken no previous anticoagulant or antiplatelet                            agents. ASA Grade Assessment: II - A patient with                            mild systemic disease. After reviewing the risks                            and benefits, the patient was deemed in                            satisfactory condition to undergo the procedure.                           After obtaining informed consent, the colonoscope  was passed under direct vision. Throughout the                            procedure, the patient's blood pressure, pulse, and                            oxygen saturations were monitored continuously. The                            Olympus [id number]) [id number] was introduced                            through the anus and advanced to the the cecum,                            identified by  appendiceal orifice and ileocecal                            valve. The colonoscopy was somewhat difficult due                            to significant looping. Successful completion of                            the procedure was aided by applying abdominal                            pressure. The patient tolerated the procedure well.                            The quality of the bowel preparation was 10 percent                            obscured. The ileocecal valve, appendiceal orifice,                            and rectum were photographed. The bowel preparation                            used was SUPREP via split dose instruction. Scope In: 3:03:29 PM Scope Out: 3:16:25 PM Scope Withdrawal Time: 0 hours 10 minutes 36 seconds  Total Procedure Duration: 0 hours 12 minutes 56 seconds  Findings:                 The perianal and digital rectal examinations were                            normal.                           Internal hemorrhoids were found.                           A tattoo was seen in the rectum. A post-polypectomy  scar was found at the tattoo site. This was                            biopsied with a cold forceps for histology.                            Verification of patient identification for the                            specimen was done. Estimated blood loss was minimal.                           Multiple diverticula were found in the sigmoid                            colon.                           A moderate amount of semi-solid stool was found in                            the cecum, interfering with visualization. Lavage                            of the area was performed, resulting in incomplete                            clearance with fair visualization.                           The exam was otherwise without abnormality on                            direct and retroflexion views. Complications:            No immediate  complications. Estimated Blood Loss:     Estimated blood loss was minimal. Impression:               - Internal hemorrhoids.                           - A tattoo was seen in the rectum. A                            post-polypectomy scar was found at the tattoo site.                            Biopsied.                           - Diverticulosis in the sigmoid colon.                           - Stool in the cecum.                           -  The examination was otherwise normal on direct                            and retroflexion views. Recommendation:           - Patient has a contact number available for                            emergencies. The signs and symptoms of potential                            delayed complications were discussed with the                            patient. Return to normal activities tomorrow.                            Written discharge instructions were provided to the                            patient.                           - Resume previous diet.                           - Continue present medications.                           - Await pathology results.                           - Rx hydrocortisone cream for bleeding hemorrhoids                            - yo use prn                           consider hemorrhoid ligation for repeated bleeding                           factor in prep limitations and previous colonoscopy                            results fr recall timing after path review Gatha Mayer, MD 05/25/2021 3:27:18 PM This report has been signed electronically.

## 2021-05-25 NOTE — Progress Notes (Signed)
Called to room to assist during endoscopic procedure.  Patient ID and intended procedure confirmed with present staff. Received instructions for my participation in the procedure from the performing physician.  

## 2021-05-25 NOTE — Patient Instructions (Addendum)
I think the bleeding came from hemorrhoids.  I did not see any signs of tumor in the rectum but I did take biopsies to check.  You also have a condition called diverticulosis - common and not usually a problem though can be a source of bleeding - doubt it was. Please read the handout provided.  I have prescribed some hydrocortisone cream to treat the hemorrhoids - if they continue to bleed I am able to fix them with a rubber banding procedure.  I appreciate the opportunity to care for you. Gatha Mayer, MD, [facility] Handouts given:Diverticulosis, Hemorrhoids Continue current medications Await pathology results Start using hydrocortisone cream for hemorrhoids  YOU HAD AN ENDOSCOPIC PROCEDURE TODAY AT [place]:   Refer to the procedure report that was given to you for any specific questions about what was found during the examination.  If the procedure report does not answer your questions, please call your gastroenterologist to clarify.  If you requested that your care partner not be given the details of your procedure findings, then the procedure report has been included in a sealed envelope for you to review at your convenience later.  YOU SHOULD EXPECT: Some feelings of bloating in the abdomen. Passage of more gas than usual.  Walking can help get rid of the air that was put into your GI tract during the procedure and reduce the bloating. If you had a lower endoscopy (such as a colonoscopy or flexible sigmoidoscopy) you may notice spotting of blood in your stool or on the toilet paper. If you underwent a bowel prep for your procedure, you may not have a normal bowel movement for a few days.  Please Note:  You might notice some irritation and congestion in your nose or some drainage.  This is from the oxygen used during your procedure.  There is no need for concern and it should clear up in a day or so.  SYMPTOMS TO REPORT IMMEDIATELY:   Following lower endoscopy  (colonoscopy or flexible sigmoidoscopy):  Excessive amounts of blood in the stool  Significant tenderness or worsening of abdominal pains  Swelling of the abdomen that is new, acute  Fever of 100F or higher  For urgent or emergent issues, a gastroenterologist can be reached at any hour by calling [phone]. Do not use MyChart messaging for urgent concerns.   DIET:  We do recommend a small meal at first, but then you may proceed to your regular diet.  Drink plenty of fluids but you should avoid alcoholic beverages for 24 hours.  ACTIVITY:  You should plan to take it easy for the rest of today and you should NOT DRIVE or use heavy machinery until tomorrow (because of the sedation medicines used during the test).    FOLLOW UP: Our staff will call the number listed on your records 48-72 hours following your procedure to check on you and address any questions or concerns that you may have regarding the information given to you following your procedure. If we do not reach you, we will leave a message.  We will attempt to reach you two times.  During this call, we will ask if you have developed any symptoms of COVID 19. If you develop any symptoms (ie: fever, flu-like symptoms, shortness of breath, cough etc.) before then, please call [phone].  If you test positive for Covid 19 in the 2 weeks post procedure, please call and report this information to [place].    If any  biopsies were taken you will be contacted by phone or by letter within the next 1-3 weeks.  Please call us at [phone] if you have not heard about the biopsies in 3 weeks.   SIGNATURES/CONFIDENTIALITY: You and/or your care partner have signed paperwork which will be entered into your electronic medical record.  These signatures attest to the fact that that the information above on your After Visit Summary has been reviewed and is understood.  Full responsibility of the confidentiality of this discharge information lies with  you and/or your care-partner.

## 2021-05-25 NOTE — Progress Notes (Signed)
Report given to PACU, vss 

## 2021-05-25 NOTE — Progress Notes (Signed)
Pt's states no medical or surgical changes since previsit or office visit. 

## 2021-05-27 ENCOUNTER — Telehealth: Payer: Self-pay | Admitting: *Deleted

## 2021-05-27 ENCOUNTER — Telehealth: Payer: Self-pay

## 2021-05-27 NOTE — Telephone Encounter (Signed)
Left message on follow up call. 

## 2021-05-27 NOTE — Telephone Encounter (Signed)
  Follow up Call-  Call back number 05/25/2021  Post procedure Call Back phone  # [phone]  Permission to leave phone message Yes  Some recent data might be hidden    LMOM to call back with any questions or concerns.  Also, call back if patient has developed fever, respiratory issues or been dx with COVID or had any family members or close contacts diagnosed since her procedure.

## 2021-06-08 ENCOUNTER — Encounter: Payer: Self-pay | Admitting: Internal Medicine
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
## 2022-01-27 ENCOUNTER — Encounter (HOSPITAL_COMMUNITY): Payer: Self-pay | Admitting: *Deleted

## 2022-01-27 ENCOUNTER — Other Ambulatory Visit: Payer: Self-pay

## 2022-01-27 ENCOUNTER — Emergency Department (HOSPITAL_COMMUNITY)
Admission: EM | Admit: 2022-01-27 | Discharge: 2022-01-28 | Disposition: A | Discharge status: Home / Self Care | Payer: Self-pay | Attending: Emergency Medicine | Admitting: Emergency Medicine

## 2022-01-27 ENCOUNTER — Emergency Department (HOSPITAL_COMMUNITY): Payer: Self-pay

## 2022-01-27 DIAGNOSIS — R079 Chest pain, unspecified: Secondary | ICD-10-CM

## 2022-01-27 DIAGNOSIS — R0789 Other chest pain: Secondary | ICD-10-CM | POA: Insufficient documentation

## 2022-01-27 LAB — COMPREHENSIVE METABOLIC PANEL
ALT: 13 U/L (ref 0–44)
AST: 25 U/L (ref 15–41)
Albumin: 3.8 g/dL (ref 3.5–5.0)
Alkaline Phosphatase: 58 U/L (ref 38–126)
Anion gap: 8 (ref 5–15)
BUN: 13 mg/dL (ref 6–20)
CO2: 25 mmol/L (ref 22–32)
Calcium: 9.2 mg/dL (ref 8.9–10.3)
Chloride: 105 mmol/L (ref 98–111)
Creatinine, Ser: 1.16 mg/dL — ABNORMAL HIGH (ref 0.44–1.00)
GFR, Estimated: 60 mL/min — ABNORMAL LOW (ref >60)
Glucose, Bld: 100 mg/dL — ABNORMAL HIGH (ref 70–99)
Potassium: 3.7 mmol/L (ref 3.5–5.1)
Sodium: 138 mmol/L (ref 135–145)
Total Bilirubin: 0.5 mg/dL (ref 0.3–1.2)
Total Protein: 6.6 g/dL (ref 6.5–8.1)

## 2022-01-27 LAB — CBC WITH DIFFERENTIAL/PLATELET
Abs Immature Granulocytes: 0.01 10*3/uL (ref 0.00–0.07)
Basophils Absolute: 0 10*3/uL (ref 0.0–0.1)
Basophils Relative: 0 %
Eosinophils Absolute: 0.2 10*3/uL (ref 0.0–0.5)
Eosinophils Relative: 3 %
HCT: 41.6 % (ref 36.0–46.0)
Hemoglobin: 13.6 g/dL (ref 12.0–15.0)
Immature Granulocytes: 0 %
Lymphocytes Relative: 54 %
Lymphs Abs: 4 10*3/uL (ref 0.7–4.0)
MCH: 29.8 pg (ref 26.0–34.0)
MCHC: 32.7 g/dL (ref 30.0–36.0)
MCV: 91 fL (ref 80.0–100.0)
Monocytes Absolute: 0.4 10*3/uL (ref 0.1–1.0)
Monocytes Relative: 5 %
Neutro Abs: 2.8 10*3/uL (ref 1.7–7.7)
Neutrophils Relative %: 38 %
Platelets: 231 10*3/uL (ref 150–400)
RBC: 4.57 MIL/uL (ref 3.87–5.11)
RDW: 12.3 % (ref 11.5–15.5)
WBC: 7.3 10*3/uL (ref 4.0–10.5)
nRBC: 0 % (ref 0.0–0.2)

## 2022-01-27 LAB — TROPONIN I (HIGH SENSITIVITY): Troponin I (High Sensitivity): <2 ng/L (ref <18)

## 2022-01-27 MED ORDER — IBUPROFEN 400 MG PO TABS
600.0000 mg | ORAL_TABLET | Freq: Once | ORAL | Status: AC
  Administered 2022-01-27: 600 mg via ORAL
  Filled 2022-01-27: qty 1

## 2022-01-27 MED ORDER — ACETAMINOPHEN 325 MG PO TABS
650.0000 mg | ORAL_TABLET | Freq: Once | ORAL | Status: AC
  Administered 2022-01-27: 650 mg via ORAL
  Filled 2022-01-27: qty 2

## 2022-01-27 NOTE — ED Provider Triage Note (Signed)
Emergency Medicine Provider Triage Evaluation Note  Kendra Vasquez , a 46 y.o. female  was evaluated in triage.  Pt complains of intermittent left-sided chest pain onset today.  Denies recent injury, trauma, heavy lifting.  Patient has associated nausea.  Has not tried a medication for symptoms.  Denies shortness of breath, abdominal pain, vomiting, fever, chills. Denies past medical history of MI, cardiac catheterization, diabetes, hypertension, stent placement.  Review of Systems  Positive: As per HPI above Negative: Shortness of breath, fever, chills  Physical Exam  BP 131/81 (BP Location: Right Arm)    Pulse 62    Temp 98.7 F (37.1 C) (Oral)    Resp 14    Ht 5\' 9"  (1.753 m)    Wt 111.6 kg    SpO2 100%    BMI 36.33 kg/m  Gen:   Awake, no distress   Resp:  Normal effort  MSK:   Moves extremities without difficulty  Other:  Chest wall tenderness to palpation left upper chest wall.  No overlying skin changes or obvious deformities.  Medical Decision Making  Medically screening exam initiated at 7:04 PM.  Appropriate orders placed.  Kendra Vasquez was informed that the remainder of the evaluation will be completed by another provider, this initial triage assessment does not replace that evaluation, and the importance of remaining in the ED until their evaluation is complete.    Suprena Travaglini A, PA-C 01/27/22 1911

## 2022-01-27 NOTE — ED Triage Notes (Signed)
Chest pain for 2 days with nausea she has had in the past  lmp none

## 2022-01-27 NOTE — ED Provider Notes (Signed)
[facility] EMERGENCY DEPARTMENT Provider Note   CSN: [id number] Arrival date & time: 01/27/22  1849     History  Chief Complaint  Patient presents with   Chest Pain    Kendra Vasquez is a 46 y.o. female.  Patient presents to ER chief complaint of intermittent left-sided chest pain.  She states started today symptoms of been coming on and off and will last about a minute or 2 each time.  It is worse when she pushes on her left chest.  Denies any recent fall or other trauma.  Denies any fevers or cough or vomiting.  Denies shortness of breath or palpitations.      Home Medications Prior to Admission medications   Medication Sig Start Date End Date Taking? Authorizing Provider  diclofenac Sodium (VOLTAREN) 1 % GEL Apply 2 g topically 3 (three) times daily as needed. 11/11/20   Wyvonnia Dusky, MD  hydrocortisone (ANUSOL-HC) 2.5 % rectal cream Place 1 application rectally 2 (two) times daily as needed for hemorrhoids (bleeding). 05/25/21   Gatha Mayer, MD  hydrocortisone (ANUSOL-HC) 2.5 % rectal cream Place 1 application rectally 2 (two) times daily. 05/25/21   Gatha Mayer, MD  Multiple Vitamin (MULTIVITAMIN) tablet Take 1 tablet by mouth daily.    [provider]  spironolactone (ALDACTONE) 50 MG tablet Take 50 mg by mouth daily.    [provider]  fluticasone (FLONASE) 50 MCG/ACT nasal spray Place 2 sprays into both nostrils daily. Patient not taking: Reported on 03/14/2015 03/12/14 06/09/15  Harden Mo, MD      Allergies    Patient has no known allergies.    Review of Systems   Review of Systems  Constitutional:  Negative for fever.  HENT:  Negative for ear pain.   Eyes:  Negative for pain.  Respiratory:  Negative for cough.   Cardiovascular:  Positive for chest pain.  Gastrointestinal:  Negative for abdominal pain.  Genitourinary:  Negative for flank pain.  Musculoskeletal:  Negative for back pain.  Skin:  Negative for rash.    Physical Exam Updated Vital Signs BP 100/60    Pulse 70    Temp 98.7 F (37.1 C) (Oral)    Resp 18    Ht 5\' 9"  (1.753 m)    Wt 111.6 kg    SpO2 100%    BMI 36.33 kg/m  Physical Exam Constitutional:      General: She is not in acute distress.    Appearance: Normal appearance.  HENT:     Head: Normocephalic.     Nose: Nose normal.  Eyes:     Extraocular Movements: Extraocular movements intact.  Cardiovascular:     Rate and Rhythm: Normal rate.  Pulmonary:     Effort: Pulmonary effort is normal.  Chest:     Comments: Left chest wall tender to palpation reproduces her pain. Musculoskeletal:        General: Normal range of motion.     Cervical back: Normal range of motion.  Neurological:     General: No focal deficit present.     Mental Status: She is alert. Mental status is at baseline.    ED Results / Procedures / Treatments   Labs (all labs ordered are listed, but only abnormal results are displayed) Labs Reviewed  COMPREHENSIVE METABOLIC PANEL - Abnormal; Notable for the following components:      Result Value   Glucose, Bld 100 (*)    Creatinine, Ser 1.16 (*)  GFR, Estimated 60 (*)    All other components within normal limits  CBC WITH DIFFERENTIAL/PLATELET  TROPONIN I (HIGH SENSITIVITY)  TROPONIN I (HIGH SENSITIVITY)    EKG EKG Interpretation  Date/Time:  Friday January 27 2022 18:56:23 EST Ventricular Rate:  63 PR Interval:  162 QRS Duration: 96 QT Interval:  388 QTC Calculation: 397 R Axis:   -15 Text Interpretation: Normal sinus rhythm Minimal voltage criteria for LVH, may be normal variant ( R in aVL ) Cannot rule out Anterior infarct , age undetermined Abnormal ECG When compared with ECG of 11-Nov-2020 07:25, PREVIOUS ECG IS PRESENT Confirmed by Thamas Jaegers ([id number]) on 01/27/2022 8:13:25 PM  Radiology DG Chest Port 1 View  Result Date: 01/27/2022 CLINICAL DATA:  Chest pain for 2 days. EXAM: PORTABLE CHEST 1 VIEW COMPARISON:  Chest CT 11/11/2020  FINDINGS: The cardiomediastinal contours are normal. The lungs are clear. Pulmonary vasculature is normal. No consolidation, pleural effusion, or pneumothorax. No acute osseous abnormalities are seen. IMPRESSION: No acute chest findings. Electronically Signed   By: Keith Rake M.D.   On: 01/27/2022 20:23    Procedures Procedures    Medications Ordered in ED Medications  acetaminophen (TYLENOL) tablet 650 mg (650 mg Oral Given 01/27/22 2127)  ibuprofen (ADVIL) tablet 600 mg (600 mg Oral Given 01/27/22 2127)    ED Course/ Medical Decision Making/ A&P                           Medical Decision Making Amount and/or Complexity of Data Reviewed Labs: ordered. Radiology: ordered.  Risk OTC drugs.   Review of record shows primary care office visit January 20, 2022 for left-sided back pain.  EKG today is unremarkable no ST elevations or depressions normal rate sinus rhythm.  Initial troponin is undetectable labs are unremarkable imaging is normal..  Patient symptoms appear improved with Tylenol and Motrin provided.  Repeat troponin ordered and pending.  Anticipate discharge outpatient follow-up with her doctor and cardiologist within the week.  Advising immediate return for worsening pain or recurrent symptoms or any additional concerns.        Final Clinical Impression(s) / ED Diagnoses Final diagnoses:  Nonspecific chest pain    Rx / DC Orders ED Discharge Orders     None         Luna Fuse, MD 01/27/22 2237

## 2022-01-28 LAB — TROPONIN I (HIGH SENSITIVITY): Troponin I (High Sensitivity): <2 ng/L (ref <18)

## 2022-01-28 MED ORDER — IBUPROFEN 600 MG PO TABS: 600.0000 mg | ORAL_TABLET | Freq: Four times a day (QID) | ORAL | 0 refills | Status: AC | PRN

## 2022-01-28 NOTE — ED Provider Notes (Signed)
I assumed care of this patient.  Please see previous provider note for further details of Hx, PE.  Briefly patient is a 46 y.o. female who presented chest pain.  Initial work-up reassuring, pending delta Trope.   Delta troponin negative.    The patient appears reasonably screened and/or stabilized for discharge and I doubt any other medical condition or other [facility] requiring further screening, evaluation, or treatment in the ED at this time prior to discharge. Safe for discharge with strict return precautions.  Disposition: Discharge  Condition: Good  I have discussed the results, Dx and Tx plan with the patient/family who expressed understanding and agree(s) with the plan. Discharge instructions discussed at length. The patient/family was given strict return precautions who verbalized understanding of the instructions. No further questions at time of discharge.    ED Discharge Orders          Ordered    ibuprofen (ADVIL) 600 MG tablet  Every 6 hours PRN        01/28/22 0319            Follow Up: Gevena Mart, DO [place] [id number] [phone]  Call  to schedule an appointment for close follow up      Nashira Mcglynn, Grayce Sessions, MD 01/28/22 [phone]

## 2022-02-28 ENCOUNTER — Ambulatory Visit (HOSPITAL_COMMUNITY)
Admission: EM | Admit: 2022-02-28 | Discharge: 2022-02-28 | Disposition: A | Discharge status: Home / Self Care | Payer: BLUE CROSS/BLUE SHIELD | Attending: Family Medicine | Admitting: Family Medicine

## 2022-02-28 ENCOUNTER — Encounter (HOSPITAL_COMMUNITY): Payer: Self-pay

## 2022-02-28 DIAGNOSIS — L02412 Cutaneous abscess of left axilla: Secondary | ICD-10-CM | POA: Diagnosis not present

## 2022-02-28 MED ORDER — HYDROCODONE-ACETAMINOPHEN 5-325 MG PO TABS: 1.0000 | ORAL_TABLET | Freq: Four times a day (QID) | ORAL | 0 refills | Status: AC | PRN

## 2022-02-28 MED ORDER — DOXYCYCLINE HYCLATE 100 MG PO CAPS: 100.0000 mg | ORAL_CAPSULE | Freq: Two times a day (BID) | ORAL | 0 refills | Status: AC

## 2022-02-28 NOTE — ED Triage Notes (Signed)
Pt c/o abscess under lt arm x2wks with no drainage. States using hot compresses with no relief.  ?

## 2022-02-28 NOTE — Discharge Instructions (Signed)
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
